# Patient Record
Sex: Female | Born: 1995 | Race: White | Hispanic: No | Marital: Single | Smoking: Former smoker
Health system: Southern US, Community
[De-identification: ages and names within clinical notes are randomized; demographics above are authoritative.]

## PROBLEM LIST (undated history)

## (undated) DIAGNOSIS — F419 Anxiety disorder, unspecified: Secondary | ICD-10-CM

## (undated) DIAGNOSIS — F4310 Post-traumatic stress disorder, unspecified: Secondary | ICD-10-CM

## (undated) DIAGNOSIS — F329 Major depressive disorder, single episode, unspecified: Secondary | ICD-10-CM

## (undated) DIAGNOSIS — F32A Depression, unspecified: Secondary | ICD-10-CM

## (undated) DIAGNOSIS — I517 Cardiomegaly: Secondary | ICD-10-CM

## (undated) DIAGNOSIS — F603 Borderline personality disorder: Secondary | ICD-10-CM

## (undated) DIAGNOSIS — J189 Pneumonia, unspecified organism: Secondary | ICD-10-CM

## (undated) HISTORY — DX: Borderline personality disorder: F60.3

## (undated) HISTORY — DX: Depression, unspecified: F32.A

## (undated) HISTORY — DX: Anxiety disorder, unspecified: F41.9

## (undated) HISTORY — DX: Cardiomegaly: I51.7

## (undated) HISTORY — DX: Pneumonia, unspecified organism: J18.9

## (undated) HISTORY — DX: Post-traumatic stress disorder, unspecified: F43.10

## (undated) HISTORY — DX: Major depressive disorder, single episode, unspecified: F32.9

## (undated) HISTORY — PX: WISDOM TOOTH EXTRACTION: SHX21

---
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
## 2017-11-20 ENCOUNTER — Encounter: Payer: Self-pay | Admitting: Adult Health

## 2017-11-20 ENCOUNTER — Ambulatory Visit: Payer: BC Managed Care – PPO | Admitting: Adult Health

## 2017-11-20 VITALS — BP 117/74 | HR 91 | Ht 63.25 in | Wt 293.7 lb

## 2017-11-20 DIAGNOSIS — Z8659 Personal history of other mental and behavioral disorders: Secondary | ICD-10-CM | POA: Insufficient documentation

## 2017-11-20 DIAGNOSIS — Z0000 Encounter for general adult medical examination without abnormal findings: Secondary | ICD-10-CM | POA: Diagnosis not present

## 2017-11-20 DIAGNOSIS — F339 Major depressive disorder, recurrent, unspecified: Secondary | ICD-10-CM | POA: Diagnosis not present

## 2017-11-20 DIAGNOSIS — F419 Anxiety disorder, unspecified: Secondary | ICD-10-CM | POA: Diagnosis not present

## 2017-11-20 DIAGNOSIS — F603 Borderline personality disorder: Secondary | ICD-10-CM | POA: Insufficient documentation

## 2017-11-20 NOTE — Assessment & Plan Note (Signed)
Body mass index is 51.62 kg/m.  Current wt 293 Increase water intake, strive for at least 100 ounces/day.   Follow Mediterranean diet Increase regular exercise.  Recommend at least 30 minutes daily, 5 days per week of walking, jogging, biking, swimming, YouTube/Pinterest workout videos.

## 2017-11-20 NOTE — Assessment & Plan Note (Signed)
She was started on Abilify 2mg  QD and Venlafaxine 37.5mg  QD April 2019 She moved back to [place] approx 4 weeks ago She has established with local therapist- Once weekly therapy sessions with Rodena Goldmann, Ph.D [place] [phone] Referral to local psychiatrist placed

## 2017-11-20 NOTE — Patient Instructions (Addendum)
Mediterranean Diet A Mediterranean diet refers to food and lifestyle choices that are based on the traditions of countries located on the [place] Sea. This way of eating has been shown to help prevent certain conditions and improve outcomes for people who have chronic diseases, like kidney disease and heart disease. What are tips for following this plan? Lifestyle  Cook and eat meals together with your family, when possible.  Drink enough fluid to keep your urine clear or pale yellow.  Be physically active every day. This includes: ? Aerobic exercise like running or swimming. ? Leisure activities like gardening, walking, or housework.  Get 7-8 hours of sleep each night.  If recommended by your health care provider, drink red wine in moderation. This means 1 glass a day for nonpregnant women and 2 glasses a day for men. A glass of wine equals 5 oz (150 mL). Reading food labels  Check the serving size of packaged foods. For foods such as rice and pasta, the serving size refers to the amount of cooked product, not dry.  Check the total fat in packaged foods. Avoid foods that have saturated fat or trans fats.  Check the ingredients list for added sugars, such as corn syrup. Shopping  At the grocery store, buy most of your food from the areas near the walls of the store. This includes: ? Fresh fruits and vegetables (produce). ? Grains, beans, nuts, and seeds. Some of these may be available in unpackaged forms or large amounts (in bulk). ? Fresh seafood. ? Poultry and eggs. ? Low-fat dairy products.  Buy whole ingredients instead of prepackaged foods.  Buy fresh fruits and vegetables in-season from local farmers markets.  Buy frozen fruits and vegetables in resealable bags.  If you do not have access to quality fresh seafood, buy precooked frozen shrimp or canned fish, such as tuna, salmon, or sardines.  Buy small amounts of raw or cooked vegetables, salads, or olives from the  deli or salad bar at your store.  Stock your pantry so you always have certain foods on hand, such as olive oil, canned tuna, canned tomatoes, rice, pasta, and beans. Cooking  Cook foods with extra-virgin olive oil instead of using butter or other vegetable oils.  Have meat as a side dish, and have vegetables or grains as your main dish. This means having meat in small portions or adding small amounts of meat to foods like pasta or stew.  Use beans or vegetables instead of meat in common dishes like chili or lasagna.  Experiment with different cooking methods. Try roasting or broiling vegetables instead of steaming or sauteing them.  Add frozen vegetables to soups, stews, pasta, or rice.  Add nuts or seeds for added healthy fat at each meal. You can add these to yogurt, salads, or vegetable dishes.  Marinate fish or vegetables using olive oil, lemon juice, garlic, and fresh herbs. Meal planning  Plan to eat 1 vegetarian meal one day each week. Try to work up to 2 vegetarian meals, if possible.  Eat seafood 2 or more times a week.  Have healthy snacks readily available, such as: ? Vegetable sticks with hummus. ? Greek yogurt. ? Fruit and nut trail mix.  Eat balanced meals throughout the week. This includes: ? Fruit: 2-3 servings a day ? Vegetables: 4-5 servings a day ? Low-fat dairy: 2 servings a day ? Fish, poultry, or lean meat: 1 serving a day ? Beans and legumes: 2 or more servings a week ? Nuts   and seeds: 1-2 servings a day ? Whole grains: 6-8 servings a day ? Extra-virgin olive oil: 3-4 servings a day  Limit red meat and sweets to only a few servings a month What are my food choices?  Mediterranean diet ? Recommended ? Grains: Whole-grain pasta. Brown rice. Bulgar wheat. Polenta. Couscous. Whole-wheat bread. [place]. ? Vegetables: Artichokes. Beets. Broccoli. Cabbage. Carrots. Eggplant. Green beans. Chard. Kale. Spinach. Onions. Leeks. Peas. Squash.  Tomatoes. Peppers. Radishes. ? Fruits: Apples. Apricots. Avocado. Berries. Bananas. Cherries. Dates. Figs. Grapes. Lemons. Melon. Oranges. Peaches. Plums. Pomegranate. ? Meats and other protein foods: Beans. Almonds. Sunflower seeds. Pine nuts. Peanuts. [place]. Salmon. Scallops. Shrimp. [place]. Tilapia. Clams. Oysters. Eggs. ? Dairy: Low-fat milk. Cheese. Greek yogurt. ? Beverages: Water. Red wine. Herbal tea. ? Fats and oils: Extra virgin olive oil. Avocado oil. Grape seed oil. ? Sweets and desserts: [place] yogurt with honey. Baked apples. Poached pears. Trail mix. ? Seasoning and other foods: Basil. Cilantro. Coriander. Cumin. Mint. Parsley. Sage. Rosemary. Tarragon. Garlic. Oregano. Thyme. Pepper. Balsalmic vinegar. Tahini. Hummus. Tomato sauce. Olives. Mushrooms. ? Limit these ? Grains: Prepackaged pasta or rice dishes. Prepackaged cereal with added sugar. ? Vegetables: Deep fried potatoes (french fries). ? Fruits: Fruit canned in syrup. ? Meats and other protein foods: Beef. Pork. Lamb. Poultry with skin. Hot dogs. Berniece Salines. ? Dairy: Ice cream. Sour cream. Whole milk. ? Beverages: Juice. Sugar-sweetened soft drinks. Beer. Liquor and spirits. ? Fats and oils: Butter. Canola oil. Vegetable oil. Beef fat (tallow). Lard. ? Sweets and desserts: Cookies. Cakes. Pies. Candy. ? Seasoning and other foods: Mayonnaise. Premade sauces and marinades. ? The items listed may not be a complete list. Talk with your dietitian about what dietary choices are right for you. Summary  The Mediterranean diet includes both food and lifestyle choices.  Eat a variety of fresh fruits and vegetables, beans, nuts, seeds, and whole grains.  Limit the amount of red meat and sweets that you eat.  Talk with your health care provider about whether it is safe for you to drink red wine in moderation. This means 1 glass a day for nonpregnant women and 2 glasses a day for men. A glass of wine equals 5 oz (150 mL). This information  is not intended to replace advice given to you by your health care provider. Make sure you discuss any questions you have with your health care provider. Document Released: 12/01/2015 Document Revised: 01/03/2016 Document Reviewed: 12/01/2015 Elsevier Interactive Patient Education  2018 [employer].   Exercising to [employer] Exercising can help you to lose weight. In order to lose weight through exercise, you need to do vigorous-intensity exercise. You can tell that you are exercising with vigorous intensity if you are breathing very hard and fast and cannot hold a conversation while exercising. Moderate-intensity exercise helps to maintain your current weight. You can tell that you are exercising at a moderate level if you have a higher heart rate and faster breathing, but you are still able to hold a conversation. How often should I exercise? Choose an activity that you enjoy and set realistic goals. Your health care provider can help you to make an activity plan that works for you. Exercise regularly as directed by your health care provider. This may include:  Doing resistance training twice each week, such as: ? Push-ups. ? Sit-ups. ? Lifting weights. ? Using resistance bands.  Doing a given intensity of exercise for a given amount of time. Choose from these options: ? 150  minutes of moderate-intensity exercise every week. ? 75 minutes of vigorous-intensity exercise every week. ? A mix of moderate-intensity and vigorous-intensity exercise every week.  Children, pregnant women, people who are out of shape, people who are overweight, and older adults may need to consult a health care provider for individual recommendations. If you have any sort of medical condition, be sure to consult your health care provider before starting a new exercise program. What are some activities that can help me to lose weight?  Walking at a rate of at least 4.5 miles an hour.  Jogging or running at a  rate of 5 miles per hour.  Biking at a rate of at least 10 miles per hour.  Lap swimming.  Roller-skating or in-line skating.  Cross-country skiing.  Vigorous competitive sports, such as football, basketball, and soccer.  Jumping rope.  Aerobic dancing. How can I be more active in my day-to-day activities?  Use the stairs instead of the elevator.  Take a walk during your lunch break.  If you drive, park your car farther away from work or school.  If you take public transportation, get off one stop early and walk the rest of the way.  Make all of your phone calls while standing up and walking around.  Get up, stretch, and walk around every 30 minutes throughout the day. What guidelines should I follow while exercising?  Do not exercise so much that you hurt yourself, feel dizzy, or get very short of breath.  Consult your health care provider prior to starting a new exercise program.  Wear comfortable clothes and shoes with good support.  Drink plenty of water while you exercise to prevent dehydration or heat stroke. Body water is lost during exercise and must be replaced.  Work out until you breathe faster and your heart beats faster. This information is not intended to replace advice given to you by your health care provider. Make sure you discuss any questions you have with your health care provider. Document Released: 05/12/2010 Document Revised: 09/15/2015 Document Reviewed: 09/10/2013 Elsevier Interactive Patient Education  2018 [employer].  Increase water intake, strive for at least 100 ounces/day.   Follow Mediterranean diet Increase regular exercise.  Recommend at least 30 minutes daily, 5 days per week of walking, jogging, biking, swimming, YouTube/Pinterest workout videos. Referral to OB/GYN placed Referral to Psychiatry placed Continue with all medications as directed. Continue with therapist as directed. Please schedule fasting lab appt in few  weeks. Please schedule complete physical in 1 month. WELCOME TO THE PRACTICE!
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
## 2017-11-20 NOTE — Assessment & Plan Note (Signed)
Increase water intake, strive for at least 100 ounces/day.   Follow Mediterranean diet Increase regular exercise.  Recommend at least 30 minutes daily, 5 days per week of walking, jogging, biking, swimming, YouTube/Pinterest workout videos. Referral to OB/GYN placed Referral to Psychiatry placed Continue with all medications as directed. Continue with therapist as directed. Please schedule fasting lab appt in few weeks. Please schedule complete physical in 1 month.

## 2017-12-06 ENCOUNTER — Other Ambulatory Visit: Payer: Self-pay | Admitting: Adult Health

## 2017-12-06 ENCOUNTER — Other Ambulatory Visit (INDEPENDENT_AMBULATORY_CARE_PROVIDER_SITE_OTHER): Payer: BC Managed Care – PPO

## 2017-12-06 DIAGNOSIS — Z0000 Encounter for general adult medical examination without abnormal findings: Secondary | ICD-10-CM

## 2017-12-06 NOTE — Telephone Encounter (Signed)
Patient came in for labs & states she forgot to ask for Rx refills on meds-- advised her provider is out of office till Monday, 8/19 and forwarding request to medical assistant for her.  --Forwarding message to medical assistant to contact pt for specific meds  Not listed but refilled needed on.  --glh

## 2017-12-07 LAB — CBC WITH DIFFERENTIAL/PLATELET
Basophils Absolute: 0 10*3/uL (ref 0.0–0.2)
Basos: 0 %
EOS (ABSOLUTE): 0.1 10*3/uL (ref 0.0–0.4)
Eos: 1 %
HEMOGLOBIN: 12 g/dL (ref 11.1–15.9)
Hematocrit: 37.2 % (ref 34.0–46.6)
IMMATURE GRANS (ABS): 0 10*3/uL (ref 0.0–0.1)
IMMATURE GRANULOCYTES: 0 %
LYMPHS: 24 %
Lymphocytes Absolute: 2.1 10*3/uL (ref 0.7–3.1)
MCH: 26.4 pg — ABNORMAL LOW (ref 26.6–33.0)
MCHC: 32.3 g/dL (ref 31.5–35.7)
MCV: 82 fL (ref 79–97)
MONOCYTES: 7 %
Monocytes Absolute: 0.7 10*3/uL (ref 0.1–0.9)
NEUTROS PCT: 68 %
Neutrophils Absolute: 6.1 10*3/uL (ref 1.4–7.0)
Platelets: 446 10*3/uL (ref 150–450)
RBC: 4.55 x10E6/uL (ref 3.77–5.28)
RDW: 15.6 % — ABNORMAL HIGH (ref 12.3–15.4)
WBC: 9.1 10*3/uL (ref 3.4–10.8)

## 2017-12-07 LAB — COMPREHENSIVE METABOLIC PANEL
ALBUMIN: 4 g/dL (ref 3.5–5.5)
ALK PHOS: 77 IU/L (ref 39–117)
ALT: 18 IU/L (ref 0–32)
AST: 16 IU/L (ref 0–40)
Albumin/Globulin Ratio: 1.2 (ref 1.2–2.2)
BUN / CREAT RATIO: 12 (ref 9–23)
BUN: 9 mg/dL (ref 6–20)
Bilirubin Total: 0.3 mg/dL (ref 0.0–1.2)
CO2: 21 mmol/L (ref 20–29)
CREATININE: 0.76 mg/dL (ref 0.57–1.00)
Calcium: 9 mg/dL (ref 8.7–10.2)
Chloride: 107 mmol/L — ABNORMAL HIGH (ref 96–106)
GFR calc Af Amer: 130 mL/min/{1.73_m2} (ref >59)
GFR calc non Af Amer: 112 mL/min/{1.73_m2} (ref >59)
GLUCOSE: 81 mg/dL (ref 65–99)
Globulin, Total: 3.4 g/dL (ref 1.5–4.5)
Potassium: 4.7 mmol/L (ref 3.5–5.2)
Sodium: 141 mmol/L (ref 134–144)
Total Protein: 7.4 g/dL (ref 6.0–8.5)

## 2017-12-07 LAB — LIPID PANEL
CHOLESTEROL TOTAL: 209 mg/dL — AB (ref 100–199)
Chol/HDL Ratio: 4.3 ratio (ref 0.0–4.4)
HDL: 49 mg/dL (ref >39)
LDL Calculated: 142 mg/dL — ABNORMAL HIGH (ref 0–99)
TRIGLYCERIDES: 88 mg/dL (ref 0–149)
VLDL Cholesterol Cal: 18 mg/dL (ref 5–40)

## 2017-12-07 LAB — TSH: TSH: 2.5 u[IU]/mL (ref 0.450–4.500)

## 2017-12-07 LAB — HEMOGLOBIN A1C
Est. average glucose Bld gHb Est-mCnc: 100 mg/dL
Hgb A1c MFr Bld: 5.1 % (ref 4.8–5.6)

## 2017-12-09 ENCOUNTER — Other Ambulatory Visit: Payer: Self-pay | Admitting: Adult Health

## 2017-12-09 MED ORDER — ARIPIPRAZOLE 2 MG PO TABS: 2.0000 mg | ORAL_TABLET | Freq: Every day | ORAL | 0 refills | Status: DC

## 2017-12-09 MED ORDER — VENLAFAXINE HCL ER 37.5 MG PO CP24: 37.5000 mg | ORAL_CAPSULE | Freq: Every day | ORAL | 0 refills | Status: DC

## 2017-12-09 NOTE — Telephone Encounter (Signed)
Good Morning Tonya, Can you please call Ms. Moore and inquire if she has made appt with psychiatrist? Referral was placed 31 Jul. Her mental health medication should be managed by psychiatrist- but is she needs it I will send in one RF. Thanks! Valetta Fuller

## 2017-12-09 NOTE — Telephone Encounter (Signed)
We have not prescribed these medications for the patient previously.  Please review and refill if appropriate.  T. Phallon Haydu, CMA  

## 2017-12-09 NOTE — Telephone Encounter (Signed)
Good Afternoon Tonya, Can you please call Ms. Jamison and share- I sent in 90 day supply of each med. Please encourage her to establish with psychiatry ASAP. Thanks! Valetta Fuller

## 2017-12-09 NOTE — Telephone Encounter (Signed)
Pt's mother states that pt is back at college and they are having a difficult time getting appt scheduled so that it does not interfere with school schedule.  She states that she will continue to try to get appt scheduled.  Advised pt that Valetta Fuller wants these medications managed by psychiatry.  Pt's mother expressed understanding.  Charyl Bigger, CMA
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
## 2017-12-19 ENCOUNTER — Encounter: Payer: BC Managed Care – PPO | Admitting: Adult Health
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
## 2018-01-14 ENCOUNTER — Ambulatory Visit (INDEPENDENT_AMBULATORY_CARE_PROVIDER_SITE_OTHER): Payer: BC Managed Care – PPO | Admitting: Adult Health

## 2018-01-14 ENCOUNTER — Encounter: Payer: Self-pay | Admitting: Adult Health

## 2018-01-14 ENCOUNTER — Other Ambulatory Visit (HOSPITAL_COMMUNITY)
Admission: RE | Admit: 2018-01-14 | Discharge: 2018-01-14 | Disposition: A | Discharge status: Home / Self Care | Payer: BC Managed Care – PPO | Source: Ambulatory Visit | Attending: Adult Health | Admitting: Adult Health

## 2018-01-14 VITALS — BP 107/70 | HR 79 | Ht 63.25 in | Wt 296.4 lb

## 2018-01-14 DIAGNOSIS — Z113 Encounter for screening for infections with a predominantly sexual mode of transmission: Secondary | ICD-10-CM | POA: Diagnosis not present

## 2018-01-14 DIAGNOSIS — Z124 Encounter for screening for malignant neoplasm of cervix: Secondary | ICD-10-CM

## 2018-01-14 DIAGNOSIS — Z0000 Encounter for general adult medical examination without abnormal findings: Secondary | ICD-10-CM

## 2018-01-14 DIAGNOSIS — Z8659 Personal history of other mental and behavioral disorders: Secondary | ICD-10-CM | POA: Diagnosis not present

## 2018-01-14 MED ORDER — VENLAFAXINE HCL ER 37.5 MG PO CP24: 37.5000 mg | ORAL_CAPSULE | Freq: Every day | ORAL | 0 refills | Status: DC

## 2018-01-14 MED ORDER — ARIPIPRAZOLE 2 MG PO TABS: 2.0000 mg | ORAL_TABLET | Freq: Every day | ORAL | 0 refills | Status: DC

## 2018-01-14 NOTE — Assessment & Plan Note (Signed)
Body mass index is 52.09 kg/m.  Current wt 296 Follow Mediterranean diet and increase regular walking

## 2018-01-14 NOTE — Assessment & Plan Note (Signed)
Referral to Psychiatry placed 11/20/17- she has not received call to schedule as a new pt. I sent in one refill on Aripiprazole (Abilify) 2mg  QD Once she establishes, that practice will take over medication management

## 2018-01-14 NOTE — Assessment & Plan Note (Signed)
Continue medications as directed- I sent refills in.  Once you establish with a psychiatrist please have them refill the venlafaxine and Abilify. Increase water intake, strive for at least 75 ounces/day.   Follow Mediterranean Diet.  Increase regular exercise.  Recommend at least 30 minutes daily, 5 days per week of walking, jogging, biking, swimming, YouTube/Pinterest workout videos. Overall your labs looks good, try to reduce saturated fat. Recommend annual physical with fasting labs.

## 2018-01-14 NOTE — Patient Instructions (Signed)
Preventive Care for Adults, Female  A healthy lifestyle and preventive care can promote health and wellness. Preventive health guidelines for women include the following key practices.   A routine yearly physical is a good way to check with your health care provider about your health and preventive screening. It is a chance to share any concerns and updates on your health and to receive a thorough exam.   Visit your dentist for a routine exam and preventive care every 6 months. Brush your teeth twice a day and floss once a day. Good oral hygiene prevents tooth decay and gum disease.   The frequency of eye exams is based on your age, health, family medical history, use of contact lenses, and other factors. Follow your health care provider's recommendations for frequency of eye exams.   Eat a healthy diet. Foods like vegetables, fruits, whole grains, low-fat dairy products, and lean protein foods contain the nutrients you need without too many calories. Decrease your intake of foods high in solid fats, added sugars, and salt. Eat the right amount of calories for you.Get information about a proper diet from your health care provider, if necessary.   Regular physical exercise is one of the most important things you can do for your health. Most adults should get at least 150 minutes of moderate-intensity exercise (any activity that increases your heart rate and causes you to sweat) each week. In addition, most adults need muscle-strengthening exercises on 2 or more days a week.   Maintain a healthy weight. The body mass index (BMI) is a screening tool to identify possible weight problems. It provides an estimate of body fat based on height and weight. Your health care provider can find your BMI, and can help you achieve or maintain a healthy weight.For adults 20 years and older:   - A BMI below 18.5 is considered underweight.   - A BMI of 18.5 to 24.9 is normal.   - A BMI of 25 to 29.9 is  considered overweight.   - A BMI of 30 and above is considered obese.   Maintain normal blood lipids and cholesterol levels by exercising and minimizing your intake of trans and saturated fats.  Eat a balanced diet with plenty of fruit and vegetables. Blood tests for lipids and cholesterol should begin at age 20 and be repeated every 5 years minimum.  If your lipid or cholesterol levels are high, you are over 40, or you are at high risk for heart disease, you may need your cholesterol levels checked more frequently.Ongoing high lipid and cholesterol levels should be treated with medicines if diet and exercise are not working.   If you smoke, find out from your health care provider how to quit. If you do not use tobacco, do not start.   Lung cancer screening is recommended for adults aged 55-80 years who are at high risk for developing lung cancer because of a history of smoking. A yearly low-dose CT scan of the lungs is recommended for people who have at least a 30-pack-year history of smoking and are a current smoker or have quit within the past 15 years. A pack year of smoking is smoking an average of 1 pack of cigarettes a day for 1 year (for example: 1 pack a day for 30 years or 2 packs a day for 15 years). Yearly screening should continue until the smoker has stopped smoking for at least 15 years. Yearly screening should be stopped for people who develop a   health problem that would prevent them from having lung cancer treatment.   If you are pregnant, do not drink alcohol. If you are breastfeeding, be very cautious about drinking alcohol. If you are not pregnant and choose to drink alcohol, do not have more than 1 drink per day. One drink is considered to be 12 ounces (355 mL) of beer, 5 ounces (148 mL) of wine, or 1.5 ounces (44 mL) of liquor.   Avoid use of street drugs. Do not share needles with anyone. Ask for help if you need support or instructions about stopping the use of  drugs.   High blood pressure causes heart disease and increases the risk of stroke. Your blood pressure should be checked at least yearly.  Ongoing high blood pressure should be treated with medicines if weight loss and exercise do not work.   If you are 69-55 years old, ask your health care provider if you should take aspirin to prevent strokes.   Diabetes screening involves taking a blood sample to check your fasting blood sugar level. This should be done once every 3 years, after age 38, if you are within normal weight and without risk factors for diabetes. Testing should be considered at a younger age or be carried out more frequently if you are overweight and have at least 1 risk factor for diabetes.   Breast cancer screening is essential preventive care for women. You should practice "breast self-awareness."  This means understanding the normal appearance and feel of your breasts and may include breast self-examination.  Any changes detected, no matter how small, should be reported to a health care provider.  Women in their 80s and 30s should have a clinical breast exam (CBE) by a health care provider as part of a regular health exam every 1 to 3 years.  After age 66, women should have a CBE every year.  Starting at age 1, women should consider having a mammogram (breast X-ray test) every year.  Women who have a family history of breast cancer should talk to their health care provider about genetic screening.  Women at a high risk of breast cancer should talk to their health care providers about having an MRI and a mammogram every year.   -Breast cancer gene (BRCA)-related cancer risk assessment is recommended for women who have family members with BRCA-related cancers. BRCA-related cancers include breast, ovarian, tubal, and peritoneal cancers. Having family members with these cancers may be associated with an increased risk for harmful changes (mutations) in the breast cancer genes BRCA1 and  BRCA2. Results of the assessment will determine the need for genetic counseling and BRCA1 and BRCA2 testing.   The Pap test is a screening test for cervical cancer. A Pap test can show cell changes on the cervix that might become cervical cancer if left untreated. A Pap test is a procedure in which cells are obtained and examined from the lower end of the uterus (cervix).   - Women should have a Pap test starting at age 57.   - Between ages 90 and 70, Pap tests should be repeated every 2 years.   - Beginning at age 63, you should have a Pap test every 3 years as long as the past 3 Pap tests have been normal.   - Some women have medical problems that increase the chance of getting cervical cancer. Talk to your health care provider about these problems. It is especially important to talk to your health care provider if a  new problem develops soon after your last Pap test. In these cases, your health care provider may recommend more frequent screening and Pap tests.   - The above recommendations are the same for women who have or have not gotten the vaccine for human papillomavirus (HPV).   - If you had a hysterectomy for a problem that was not cancer or a condition that could lead to cancer, then you no longer need Pap tests. Even if you no longer need a Pap test, a regular exam is a good idea to make sure no other problems are starting.   - If you are between ages 36 and 66 years, and you have had normal Pap tests going back 10 years, you no longer need Pap tests. Even if you no longer need a Pap test, a regular exam is a good idea to make sure no other problems are starting.   - If you have had past treatment for cervical cancer or a condition that could lead to cancer, you need Pap tests and screening for cancer for at least 20 years after your treatment.   - If Pap tests have been discontinued, risk factors (such as a new sexual partner) need to be reassessed to determine if screening should  be resumed.   - The HPV test is an additional test that may be used for cervical cancer screening. The HPV test looks for the virus that can cause the cell changes on the cervix. The cells collected during the Pap test can be tested for HPV. The HPV test could be used to screen women aged 70 years and older, and should be used in women of any age who have unclear Pap test results. After the age of 67, women should have HPV testing at the same frequency as a Pap test.   Colorectal cancer can be detected and often prevented. Most routine colorectal cancer screening begins at the age of 57 years and continues through age 26 years. However, your health care provider may recommend screening at an earlier age if you have risk factors for colon cancer. On a yearly basis, your health care provider may provide home test kits to check for hidden blood in the stool.  Use of a small camera at the end of a tube, to directly examine the colon (sigmoidoscopy or colonoscopy), can detect the earliest forms of colorectal cancer. Talk to your health care provider about this at age 23, when routine screening begins. Direct exam of the colon should be repeated every 5 -10 years through age 49 years, unless early forms of pre-cancerous polyps or small growths are found.   People who are at an increased risk for hepatitis B should be screened for this virus. You are considered at high risk for hepatitis B if:  -You were born in a country where hepatitis B occurs often. Talk with your health care provider about which countries are considered high risk.  - Your parents were born in a high-risk country and you have not received a shot to protect against hepatitis B (hepatitis B vaccine).  - You have HIV or AIDS.  - You use needles to inject street drugs.  - You live with, or have sex with, someone who has Hepatitis B.  - You get hemodialysis treatment.  - You take certain medicines for conditions like cancer, organ  transplantation, and autoimmune conditions.   Hepatitis C blood testing is recommended for all people born from [birth year] through 1965 and any individual  with known risks for hepatitis C.   Practice safe sex. Use condoms and avoid high-risk sexual practices to reduce the spread of sexually transmitted infections (STIs). STIs include gonorrhea, chlamydia, syphilis, trichomonas, herpes, HPV, and human immunodeficiency virus (HIV). Herpes, HIV, and HPV are viral illnesses that have no cure. They can result in disability, cancer, and death. Sexually active women aged 25 years and younger should be checked for chlamydia. Older women with new or multiple partners should also be tested for chlamydia. Testing for other STIs is recommended if you are sexually active and at increased risk.   Osteoporosis is a disease in which the bones lose minerals and strength with aging. This can result in serious bone fractures or breaks. The risk of osteoporosis can be identified using a bone density scan. Women ages 65 years and over and women at risk for fractures or osteoporosis should discuss screening with their health care providers. Ask your health care provider whether you should take a calcium supplement or vitamin D to There are also several preventive steps women can take to avoid osteoporosis and resulting fractures or to keep osteoporosis from worsening. -->Recommendations include:  Eat a balanced diet high in fruits, vegetables, calcium, and vitamins.  Get enough calcium. The recommended total intake of is 1,200 mg daily; for best absorption, if taking supplements, divide doses into 250-500 mg doses throughout the day. Of the two types of calcium, calcium carbonate is best absorbed when taken with food but calcium citrate can be taken on an empty stomach.  Get enough vitamin D. NAMS and the National Osteoporosis Foundation recommend at least 1,000 IU per day for women age 50 and over who are at risk of vitamin D  deficiency. Vitamin D deficiency can be caused by inadequate sun exposure (for example, those who live in northern latitudes).  Avoid alcohol and smoking. Heavy alcohol intake (more than 7 drinks per week) increases the risk of falls and hip fracture and women smokers tend to lose bone more rapidly and have lower bone mass than nonsmokers. Stopping smoking is one of the most important changes women can make to improve their health and decrease risk for disease.  Be physically active every day. Weight-bearing exercise (for example, fast walking, hiking, jogging, and weight training) may strengthen bones or slow the rate of bone loss that comes with aging. Balancing and muscle-strengthening exercises can reduce the risk of falling and fracture.  Consider therapeutic medications. Currently, several types of effective drugs are available. Healthcare providers can recommend the type most appropriate for each woman.  Eliminate environmental factors that may contribute to accidents. Falls cause nearly 90% of all osteoporotic fractures, so reducing this risk is an important bone-health strategy. Measures include ample lighting, removing obstructions to walking, using nonskid rugs on floors, and placing mats and/or grab bars in showers.  Be aware of medication side effects. Some common medicines make bones weaker. These include a type of steroid drug called glucocorticoids used for arthritis and asthma, some antiseizure drugs, certain sleeping pills, treatments for endometriosis, and some cancer drugs. An overactive thyroid gland or using too much thyroid hormone for an underactive thyroid can also be a problem. If you are taking these medicines, talk to your doctor about what you can do to help protect your bones.reduce the rate of osteoporosis.    Menopause can be associated with physical symptoms and risks. Hormone replacement therapy is available to decrease symptoms and risks. You should talk to your  health care provider   about whether hormone replacement therapy is right for you.   Use sunscreen. Apply sunscreen liberally and repeatedly throughout the day. You should seek shade when your shadow is shorter than you. Protect yourself by wearing long sleeves, pants, a wide-brimmed hat, and sunglasses year round, whenever you are outdoors.   Once a month, do a whole body skin exam, using a mirror to look at the skin on your back. Tell your health care provider of new moles, moles that have irregular borders, moles that are larger than a pencil eraser, or moles that have changed in shape or color.   -Stay current with required vaccines (immunizations).   Influenza vaccine. All adults should be immunized every year.  Tetanus, diphtheria, and acellular pertussis (Td, Tdap) vaccine. Pregnant women should receive 1 dose of Tdap vaccine during each pregnancy. The dose should be obtained regardless of the length of time since the last dose. Immunization is preferred during the 27th 36th week of gestation. An adult who has not previously received Tdap or who does not know her vaccine status should receive 1 dose of Tdap. This initial dose should be followed by tetanus and diphtheria toxoids (Td) booster doses every 10 years. Adults with an unknown or incomplete history of completing a 3-dose immunization series with Td-containing vaccines should begin or complete a primary immunization series including a Tdap dose. Adults should receive a Td booster every 10 years.  Varicella vaccine. An adult without evidence of immunity to varicella should receive 2 doses or a second dose if she has previously received 1 dose. Pregnant females who do not have evidence of immunity should receive the first dose after pregnancy. This first dose should be obtained before leaving the health care facility. The second dose should be obtained 4 8 weeks after the first dose.  Human papillomavirus (HPV) vaccine. Females aged 13 26  years who have not received the vaccine previously should obtain the 3-dose series. The vaccine is not recommended for use in pregnant females. However, pregnancy testing is not needed before receiving a dose. If a female is found to be pregnant after receiving a dose, no treatment is needed. In that case, the remaining doses should be delayed until after the pregnancy. Immunization is recommended for any person with an immunocompromised condition through the age of 26 years if she did not get any or all doses earlier. During the 3-dose series, the second dose should be obtained 4 8 weeks after the first dose. The third dose should be obtained 24 weeks after the first dose and 16 weeks after the second dose.  Zoster vaccine. One dose is recommended for adults aged 60 years or older unless certain conditions are present.  Measles, mumps, and rubella (MMR) vaccine. Adults born before 1957 generally are considered immune to measles and mumps. Adults born in 1957 or later should have 1 or more doses of MMR vaccine unless there is a contraindication to the vaccine or there is laboratory evidence of immunity to each of the three diseases. A routine second dose of MMR vaccine should be obtained at least 28 days after the first dose for students attending postsecondary schools, health care workers, or international travelers. People who received inactivated measles vaccine or an unknown type of measles vaccine during 1963 1967 should receive 2 doses of MMR vaccine. People who received inactivated mumps vaccine or an unknown type of mumps vaccine before 1979 and are at high risk for mumps infection should consider immunization with 2 doses of   MMR vaccine. For females of childbearing age, rubella immunity should be determined. If there is no evidence of immunity, females who are not pregnant should be vaccinated. If there is no evidence of immunity, females who are pregnant should delay immunization until after pregnancy.  Unvaccinated health care workers born before [birth year] who lack laboratory evidence of measles, mumps, or rubella immunity or laboratory confirmation of disease should consider measles and mumps immunization with 2 doses of MMR vaccine or rubella immunization with 1 dose of MMR vaccine.  Pneumococcal 13-valent conjugate (PCV13) vaccine. When indicated, a person who is uncertain of her immunization history and has no record of immunization should receive the PCV13 vaccine. An adult aged 54 years or older who has certain medical conditions and has not been previously immunized should receive 1 dose of PCV13 vaccine. This PCV13 should be followed with a dose of pneumococcal polysaccharide (PPSV23) vaccine. The PPSV23 vaccine dose should be obtained at least 8 weeks after the dose of PCV13 vaccine. An adult aged 58 years or older who has certain medical conditions and previously received 1 or more doses of PPSV23 vaccine should receive 1 dose of PCV13. The PCV13 vaccine dose should be obtained 1 or more years after the last PPSV23 vaccine dose.  Pneumococcal polysaccharide (PPSV23) vaccine. When PCV13 is also indicated, PCV13 should be obtained first. All adults aged 58 years and older should be immunized. An adult younger than age 65 years who has certain medical conditions should be immunized. Any person who resides in a nursing home or long-term care facility should be immunized. An adult smoker should be immunized. People with an immunocompromised condition and certain other conditions should receive both PCV13 and PPSV23 vaccines. People with human immunodeficiency virus (HIV) infection should be immunized as soon as possible after diagnosis. Immunization during chemotherapy or radiation therapy should be avoided. Routine use of PPSV23 vaccine is not recommended for American Indians, [place] Natives, or people younger than 65 years unless there are medical conditions that require PPSV23 vaccine. When indicated,  people who have unknown immunization and have no record of immunization should receive PPSV23 vaccine. One-time revaccination 5 years after the first dose of PPSV23 is recommended for people aged 70 64 years who have chronic kidney failure, nephrotic syndrome, asplenia, or immunocompromised conditions. People who received 1 2 doses of PPSV23 before age 32 years should receive another dose of PPSV23 vaccine at age 96 years or later if at least 5 years have passed since the previous dose. Doses of PPSV23 are not needed for people immunized with PPSV23 at or after age 55 years.  Meningococcal vaccine. Adults with asplenia or persistent complement component deficiencies should receive 2 doses of quadrivalent meningococcal conjugate (MenACWY-D) vaccine. The doses should be obtained at least 2 months apart. Microbiologists working with certain meningococcal bacteria, [employer] recruits, people at risk during an outbreak, and people who travel to or live in countries with a high rate of meningitis should be immunized. A first-year college student up through age 58 years who is living in a residence hall should receive a dose if she did not receive a dose on or after her 16th birthday. Adults who have certain high-risk conditions should receive one or more doses of vaccine.  Hepatitis A vaccine. Adults who wish to be protected from this disease, have certain high-risk conditions, work with hepatitis A-infected animals, work in hepatitis A research labs, or travel to or work in countries with a high rate of hepatitis A should be  immunized. Adults who were previously unvaccinated and who anticipate close contact with an international adoptee during the first 60 days after arrival in the [place] from a country with a high rate of hepatitis A should be immunized.  Hepatitis B vaccine.  Adults who wish to be protected from this disease, have certain high-risk conditions, may be exposed to blood or other infectious  body fluids, are household contacts or sex partners of hepatitis B positive people, are clients or workers in certain care facilities, or travel to or work in countries with a high rate of hepatitis B should be immunized.  Haemophilus influenzae type b (Hib) vaccine. A previously unvaccinated person with asplenia or sickle cell disease or having a scheduled splenectomy should receive 1 dose of Hib vaccine. Regardless of previous immunization, a recipient of a hematopoietic stem cell transplant should receive a 3-dose series 6 12 months after her successful transplant. Hib vaccine is not recommended for adults with HIV infection.  Preventive Services / Frequency Ages 6 to 39years  Blood pressure check.** / Every 1 to 2 years.  Lipid and cholesterol check.** / Every 5 years beginning at age 39.  Clinical breast exam.** / Every 3 years for women in their 61s and 62s.  BRCA-related cancer risk assessment.** / For women who have family members with a BRCA-related cancer (breast, ovarian, tubal, or peritoneal cancers).  Pap test.** / Every 2 years from ages 47 through 85. Every 3 years starting at age 34 through age 12 or 74 with a history of 3 consecutive normal Pap tests.  HPV screening.** / Every 3 years from ages 46 through ages 43 to 54 with a history of 3 consecutive normal Pap tests.  Hepatitis C blood test.** / For any individual with known risks for hepatitis C.  Skin self-exam. / Monthly.  Influenza vaccine. / Every year.  Tetanus, diphtheria, and acellular pertussis (Tdap, Td) vaccine.** / Consult your health care provider. Pregnant women should receive 1 dose of Tdap vaccine during each pregnancy. 1 dose of Td every 10 years.  Varicella vaccine.** / Consult your health care provider. Pregnant females who do not have evidence of immunity should receive the first dose after pregnancy.  HPV vaccine. / 3 doses over 6 months, if 64 and younger. The vaccine is not recommended for use in  pregnant females. However, pregnancy testing is not needed before receiving a dose.  Measles, mumps, rubella (MMR) vaccine.** / You need at least 1 dose of MMR if you were born in 1957 or later. You may also need a 2nd dose. For females of childbearing age, rubella immunity should be determined. If there is no evidence of immunity, females who are not pregnant should be vaccinated. If there is no evidence of immunity, females who are pregnant should delay immunization until after pregnancy.  Pneumococcal 13-valent conjugate (PCV13) vaccine.** / Consult your health care provider.  Pneumococcal polysaccharide (PPSV23) vaccine.** / 1 to 2 doses if you smoke cigarettes or if you have certain conditions.  Meningococcal vaccine.** / 1 dose if you are age 71 to 37 years and a Market researcher living in a residence hall, or have one of several medical conditions, you need to get vaccinated against meningococcal disease. You may also need additional booster doses.  Hepatitis A vaccine.** / Consult your health care provider.  Hepatitis B vaccine.** / Consult your health care provider.  Haemophilus influenzae type b (Hib) vaccine.** / Consult your health care provider.  Ages 55 to 64years  Blood pressure check.** / Every 1 to 2 years.  Lipid and cholesterol check.** / Every 5 years beginning at age 20 years.  Lung cancer screening. / Every year if you are aged 55 80 years and have a 30-pack-year history of smoking and currently smoke or have quit within the past 15 years. Yearly screening is stopped once you have quit smoking for at least 15 years or develop a health problem that would prevent you from having lung cancer treatment.  Clinical breast exam.** / Every year after age 40 years.  BRCA-related cancer risk assessment.** / For women who have family members with a BRCA-related cancer (breast, ovarian, tubal, or peritoneal cancers).  Mammogram.** / Every year beginning at age 40  years and continuing for as long as you are in good health. Consult with your health care provider.  Pap test.** / Every 3 years starting at age 30 years through age 65 or 70 years with a history of 3 consecutive normal Pap tests.  HPV screening.** / Every 3 years from ages 30 years through ages 65 to 70 years with a history of 3 consecutive normal Pap tests.  Fecal occult blood test (FOBT) of stool. / Every year beginning at age 50 years and continuing until age 75 years. You may not need to do this test if you get a colonoscopy every 10 years.  Flexible sigmoidoscopy or colonoscopy.** / Every 5 years for a flexible sigmoidoscopy or every 10 years for a colonoscopy beginning at age 50 years and continuing until age 75 years.  Hepatitis C blood test.** / For all people born from 1945 through 1965 and any individual with known risks for hepatitis C.  Skin self-exam. / Monthly.  Influenza vaccine. / Every year.  Tetanus, diphtheria, and acellular pertussis (Tdap/Td) vaccine.** / Consult your health care provider. Pregnant women should receive 1 dose of Tdap vaccine during each pregnancy. 1 dose of Td every 10 years.  Varicella vaccine.** / Consult your health care provider. Pregnant females who do not have evidence of immunity should receive the first dose after pregnancy.  Zoster vaccine.** / 1 dose for adults aged 60 years or older.  Measles, mumps, rubella (MMR) vaccine.** / You need at least 1 dose of MMR if you were born in 1957 or later. You may also need a 2nd dose. For females of childbearing age, rubella immunity should be determined. If there is no evidence of immunity, females who are not pregnant should be vaccinated. If there is no evidence of immunity, females who are pregnant should delay immunization until after pregnancy.  Pneumococcal 13-valent conjugate (PCV13) vaccine.** / Consult your health care provider.  Pneumococcal polysaccharide (PPSV23) vaccine.** / 1 to 2 doses if  you smoke cigarettes or if you have certain conditions.  Meningococcal vaccine.** / Consult your health care provider.  Hepatitis A vaccine.** / Consult your health care provider.  Hepatitis B vaccine.** / Consult your health care provider.  Haemophilus influenzae type b (Hib) vaccine.** / Consult your health care provider.  Ages 65 years and over  Blood pressure check.** / Every 1 to 2 years.  Lipid and cholesterol check.** / Every 5 years beginning at age 20 years.  Lung cancer screening. / Every year if you are aged 55 80 years and have a 30-pack-year history of smoking and currently smoke or have quit within the past 15 years. Yearly screening is stopped once you have quit smoking for at least 15 years or develop a health problem that   would prevent you from having lung cancer treatment.  Clinical breast exam.** / Every year after age 103 years.  BRCA-related cancer risk assessment.** / For women who have family members with a BRCA-related cancer (breast, ovarian, tubal, or peritoneal cancers).  Mammogram.** / Every year beginning at age 36 years and continuing for as long as you are in good health. Consult with your health care provider.  Pap test.** / Every 3 years starting at age 5 years through age 85 or 10 years with 3 consecutive normal Pap tests. Testing can be stopped between 65 and 70 years with 3 consecutive normal Pap tests and no abnormal Pap or HPV tests in the past 10 years.  HPV screening.** / Every 3 years from ages 93 years through ages 70 or 45 years with a history of 3 consecutive normal Pap tests. Testing can be stopped between 65 and 70 years with 3 consecutive normal Pap tests and no abnormal Pap or HPV tests in the past 10 years.  Fecal occult blood test (FOBT) of stool. / Every year beginning at age 8 years and continuing until age 45 years. You may not need to do this test if you get a colonoscopy every 10 years.  Flexible sigmoidoscopy or colonoscopy.** /  Every 5 years for a flexible sigmoidoscopy or every 10 years for a colonoscopy beginning at age 69 years and continuing until age 68 years.  Hepatitis C blood test.** / For all people born from [birth year] through 1965 and any individual with known risks for hepatitis C.  Osteoporosis screening.** / A one-time screening for women ages 7 years and over and women at risk for fractures or osteoporosis.  Skin self-exam. / Monthly.  Influenza vaccine. / Every year.  Tetanus, diphtheria, and acellular pertussis (Tdap/Td) vaccine.** / 1 dose of Td every 10 years.  Varicella vaccine.** / Consult your health care provider.  Zoster vaccine.** / 1 dose for adults aged 5 years or older.  Pneumococcal 13-valent conjugate (PCV13) vaccine.** / Consult your health care provider.  Pneumococcal polysaccharide (PPSV23) vaccine.** / 1 dose for all adults aged 74 years and older.  Meningococcal vaccine.** / Consult your health care provider.  Hepatitis A vaccine.** / Consult your health care provider.  Hepatitis B vaccine.** / Consult your health care provider.  Haemophilus influenzae type b (Hib) vaccine.** / Consult your health care provider. ** Family history and personal history of risk and conditions may change your health care provider's recommendations. Document Released: 06/05/2001 Document Revised: 01/28/2013  [facility] Patient Information 2014 [place], [place].   EXERCISE AND DIET:  We recommended that you start or continue a regular exercise program for good health. Regular exercise means any activity that makes your heart beat faster and makes you sweat.  We recommend exercising at least 30 minutes per day at least 3 days a week, preferably 5.  We also recommend a diet low in fat and sugar / carbohydrates.  Inactivity, poor dietary choices and obesity can cause diabetes, heart attack, stroke, and kidney damage, among others.     ALCOHOL AND SMOKING:  Women should limit their alcohol intake to no  more than 7 drinks/beers/glasses of wine (combined, not each!) per week. Moderation of alcohol intake to this level decreases your risk of breast cancer and liver damage.  ( And of course, no recreational drugs are part of a healthy lifestyle.)  Also, you should not be smoking at all or even being exposed to second hand smoke. Most people know smoking can  cause cancer, and various heart and lung diseases, but did you know it also contributes to weakening of your bones?  Aging of your skin?  Yellowing of your teeth and nails?   CALCIUM AND VITAMIN D:  Adequate intake of calcium and Vitamin D are recommended.  The recommendations for exact amounts of these supplements seem to change often, but generally speaking 600 mg of calcium (either carbonate or citrate) and 800 units of Vitamin D per day seems prudent. Certain women may benefit from higher intake of Vitamin D.  If you are among these women, your doctor will have told you during your visit.     PAP SMEARS:  Pap smears, to check for cervical cancer or precancers,  have traditionally been done yearly, although recent scientific advances have shown that most women can have pap smears less often.  However, every woman still should have a physical exam from her gynecologist or primary care physician every year. It will include a breast check, inspection of the vulva and vagina to check for abnormal growths or skin changes, a visual exam of the cervix, and then an exam to evaluate the size and shape of the uterus and ovaries.  And after 22 years of age, a rectal exam is indicated to check for rectal cancers. We will also provide age appropriate advice regarding health maintenance, like when you should have certain vaccines, screening for sexually transmitted diseases, bone density testing, colonoscopy, mammograms, etc.    MAMMOGRAMS:  All women over 65 years old should have a yearly mammogram. Many facilities now offer a "3D" mammogram, which may cost  around $50 extra out of pocket. If possible,  we recommend you accept the option to have the 3D mammogram performed.  It both reduces the number of women who will be called back for extra views which then turn out to be normal, and it is better than the routine mammogram at detecting truly abnormal areas.     COLONOSCOPY:  Colonoscopy to screen for colon cancer is recommended for all women at age 26.  We know, you hate the idea of the prep.  We agree, BUT, having colon cancer and not knowing it is worse!!  Colon cancer so often starts as a polyp that can be seen and removed at colonscopy, which can quite literally save your life!  And if your first colonoscopy is normal and you have no family history of colon cancer, most women don't have to have it again for 10 years.  Once every ten years, you can do something that may end up saving your life, right?  We will be happy to help you get it scheduled when you are ready.  Be sure to check your insurance coverage so you understand how much it will cost.  It may be covered as a preventative service at no cost, but you should check your particular policy.    Mediterranean Diet A Mediterranean diet refers to food and lifestyle choices that are based on the traditions of countries located on the [employer]. This way of eating has been shown to help prevent certain conditions and improve outcomes for people who have chronic diseases, like kidney disease and heart disease. What are tips for following this plan? Lifestyle  Cook and eat meals together with your family, when possible.  Drink enough fluid to keep your urine clear or pale yellow.  Be physically active every day. This includes: ? Aerobic exercise like running or swimming. ? Leisure activities like  gardening, walking, or housework.  Get 7-8 hours of sleep each night.  If recommended by your health care provider, drink red wine in moderation. This means 1 glass a day for nonpregnant women  and 2 glasses a day for men. A glass of wine equals 5 oz (150 mL). Reading food labels  Check the serving size of packaged foods. For foods such as rice and pasta, the serving size refers to the amount of cooked product, not dry.  Check the total fat in packaged foods. Avoid foods that have saturated fat or trans fats.  Check the ingredients list for added sugars, such as corn syrup. Shopping  At the grocery store, buy most of your food from the areas near the walls of the store. This includes: ? Fresh fruits and vegetables (produce). ? Grains, beans, nuts, and seeds. Some of these may be available in unpackaged forms or large amounts (in bulk). ? Fresh seafood. ? Poultry and eggs. ? Low-fat dairy products.  Buy whole ingredients instead of prepackaged foods.  Buy fresh fruits and vegetables in-season from local farmers markets.  Buy frozen fruits and vegetables in resealable bags.  If you do not have access to quality fresh seafood, buy precooked frozen shrimp or canned fish, such as tuna, salmon, or sardines.  Buy small amounts of raw or cooked vegetables, salads, or olives from the deli or salad bar at your store.  Stock your pantry so you always have certain foods on hand, such as olive oil, canned tuna, canned tomatoes, rice, pasta, and beans. Cooking  Cook foods with extra-virgin olive oil instead of using butter or other vegetable oils.  Have meat as a side dish, and have vegetables or grains as your main dish. This means having meat in small portions or adding small amounts of meat to foods like pasta or stew.  Use beans or vegetables instead of meat in common dishes like chili or lasagna.  Experiment with different cooking methods. Try roasting or broiling vegetables instead of steaming or sauteing them.  Add frozen vegetables to soups, stews, pasta, or rice.  Add nuts or seeds for added healthy fat at each meal. You can add these to yogurt, salads, or vegetable  dishes.  Marinate fish or vegetables using olive oil, lemon juice, garlic, and fresh herbs. Meal planning  Plan to eat 1 vegetarian meal one day each week. Try to work up to 2 vegetarian meals, if possible.  Eat seafood 2 or more times a week.  Have healthy snacks readily available, such as: ? Vegetable sticks with hummus. ? [place] yogurt. ? Fruit and nut trail mix.  Eat balanced meals throughout the week. This includes: ? Fruit: 2-3 servings a day ? Vegetables: 4-5 servings a day ? Low-fat dairy: 2 servings a day ? Fish, poultry, or lean meat: 1 serving a day ? Beans and legumes: 2 or more servings a week ? Nuts and seeds: 1-2 servings a day ? Whole grains: 6-8 servings a day ? Extra-virgin olive oil: 3-4 servings a day  Limit red meat and sweets to only a few servings a month What are my food choices?  Mediterranean diet ? Recommended ? Grains: Whole-grain pasta. Brown rice. Bulgar wheat. Polenta. Couscous. Whole-wheat bread. [place]. ? Vegetables: Artichokes. Beets. Broccoli. Cabbage. Carrots. Eggplant. Green beans. Chard. Kale. Spinach. Onions. Leeks. Peas. Squash. Tomatoes. Peppers. Radishes. ? Fruits: Apples. Apricots. Avocado. Berries. Bananas. Cherries. Dates. Figs. Grapes. Lemons. Melon. Oranges. Peaches. Plums. Pomegranate. ? Meats and other protein  foods: Beans. Almonds. Sunflower seeds. Pine nuts. Peanuts. [place]. Salmon. Scallops. Shrimp. [place]. Tilapia. Clams. Oysters. Eggs. ? Dairy: Low-fat milk. Cheese. Greek yogurt. ? Beverages: Water. Red wine. Herbal tea. ? Fats and oils: Extra virgin olive oil. Avocado oil. Grape seed oil. ? Sweets and desserts: [place] yogurt with honey. Baked apples. Poached pears. Trail mix. ? Seasoning and other foods: Basil. Cilantro. Coriander. Cumin. Mint. Parsley. Sage. Rosemary. Tarragon. Garlic. Oregano. Thyme. Pepper. Balsalmic vinegar. Tahini. Hummus. Tomato sauce. Olives. Mushrooms. ? Limit these ? Grains: Prepackaged pasta or  rice dishes. Prepackaged cereal with added sugar. ? Vegetables: Deep fried potatoes (french fries). ? Fruits: Fruit canned in syrup. ? Meats and other protein foods: Beef. Pork. Lamb. Poultry with skin. Hot dogs. Berniece Salines. ? Dairy: Ice cream. Sour cream. Whole milk. ? Beverages: Juice. Sugar-sweetened soft drinks. Beer. Liquor and spirits. ? Fats and oils: Butter. Canola oil. Vegetable oil. Beef fat (tallow). Lard. ? Sweets and desserts: Cookies. Cakes. Pies. Candy. ? Seasoning and other foods: Mayonnaise. Premade sauces and marinades. ? The items listed may not be a complete list. Talk with your dietitian about what dietary choices are right for you. Summary  The Mediterranean diet includes both food and lifestyle choices.  Eat a variety of fresh fruits and vegetables, beans, nuts, seeds, and whole grains.  Limit the amount of red meat and sweets that you eat.  Talk with your health care provider about whether it is safe for you to drink red wine in moderation. This means 1 glass a day for nonpregnant women and 2 glasses a day for men. A glass of wine equals 5 oz (150 mL). This information is not intended to replace advice given to you by your health care provider. Make sure you discuss any questions you have with your health care provider. Document Released: 12/01/2015 Document Revised: 01/03/2016 Document Reviewed: 12/01/2015 Elsevier Interactive Patient Education  2018 [employer].   Continue medications as directed- I sent refills in.  Once you establish with a psychiatrist please have them refill the venlafaxine and Abilify. Increase water intake, strive for at least 75 ounces/day.   Follow Mediterranean Diet.  Increase regular exercise.  Recommend at least 30 minutes daily, 5 days per week of walking, jogging, biking, swimming, YouTube/Pinterest workout videos. Overall your labs looks good, try to reduce saturated fat. Recommend annual physical with fasting labs. HAVE A GREAT  SCHOOL YEAR!

## 2018-01-17 LAB — CYTOLOGY - PAP
Candida vaginitis: NEGATIVE
Chlamydia: NEGATIVE
Diagnosis: NEGATIVE
Neisseria Gonorrhea: NEGATIVE
TRICH (WINDOWPATH): NEGATIVE

## 2018-03-11 ENCOUNTER — Ambulatory Visit (HOSPITAL_COMMUNITY): Payer: Self-pay | Admitting: Licensed Clinical Social Worker

## 2018-03-13 ENCOUNTER — Ambulatory Visit (INDEPENDENT_AMBULATORY_CARE_PROVIDER_SITE_OTHER): Payer: Self-pay | Admitting: Licensed Clinical Social Worker

## 2018-03-13 DIAGNOSIS — F603 Borderline personality disorder: Secondary | ICD-10-CM
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
## 2018-05-14 ENCOUNTER — Encounter (HOSPITAL_COMMUNITY): Payer: Self-pay | Admitting: Psychiatry

## 2018-05-14 ENCOUNTER — Ambulatory Visit (INDEPENDENT_AMBULATORY_CARE_PROVIDER_SITE_OTHER): Payer: BC Managed Care – PPO | Admitting: Psychiatry

## 2018-05-14 VITALS — BP 116/65 | HR 78 | Ht 63.5 in | Wt 310.0 lb

## 2018-05-14 DIAGNOSIS — F603 Borderline personality disorder: Secondary | ICD-10-CM | POA: Diagnosis not present

## 2018-05-14 DIAGNOSIS — F319 Bipolar disorder, unspecified: Secondary | ICD-10-CM | POA: Diagnosis not present

## 2018-05-14 DIAGNOSIS — F4310 Post-traumatic stress disorder, unspecified: Secondary | ICD-10-CM | POA: Diagnosis not present

## 2018-05-14 MED ORDER — LAMOTRIGINE 25 MG PO TABS: ORAL_TABLET | ORAL | 1 refills | Status: DC

## 2018-05-14 MED ORDER — ARIPIPRAZOLE 2 MG PO TABS: 2.0000 mg | ORAL_TABLET | Freq: Every day | ORAL | 0 refills | Status: DC

## 2018-05-14 MED ORDER — VENLAFAXINE HCL ER 37.5 MG PO CP24: 37.5000 mg | ORAL_CAPSULE | Freq: Every day | ORAL | 0 refills | Status: DC
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
## 2018-06-07 NOTE — Progress Notes (Addendum)
Subjective:    Patient ID: Anna [name], female    DOB: 05-24-1995, 23 y.o.   MRN: [id number]  HPI:  Anna [name] presents with several issues: 1) Would like to stop oral birth control and change over to Nexplanon, due to frequently forgetting to take daily pill. Referral to GYN placed. She denies tobacco use, she quite vape use one week ago- great! 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 She denies any other acute incident or injury. Since then she reports constant 6/10-8/10 thoracic back paint that is described as "sharp, pressure" She completed full course of PT in 2015- in [place] somewhere, records not in system She reports still performing home PT and additional exercising and still experiencing pain. She denies change in bowel/bladder habits. She denies numbness/tingling in hands or feet. She has never had xray or MRI of back Discussed that body habitus can contribute to back pain, current wt is 312, Body mass index is 54.83 kg/m.  Patient Care Team    Relationship Specialty Notifications Start End  Anna Grandchild, NP PCP - General Family Medicine  11/20/17     Patient Active Problem List   Diagnosis Date Noted  . Thoracic back pain 06/09/2018  . Birth control counseling 06/09/2018  . Depression, recurrent ([place]) 11/20/2017  . H/O borderline personality disorder 11/20/2017  . Healthcare maintenance 11/20/2017  . Anxiety 11/20/2017  . Morbid obesity ([place]) 11/20/2017  . Borderline personality disorder ([place]) 11/20/2017     Past Medical History:  Diagnosis Date  . Anxiety   . Borderline personality disorder ([place])   . Depression   . Enlarged heart   . Pneumonia   . PTSD (post-traumatic stress disorder)      History reviewed. No pertinent surgical history.   Family History  Problem  Relation Age of Onset  . Clotting disorder Mother   . Cancer Father   . COPD Father        skin  . Depression Father   . Healthy Sister   . Healthy Brother   . Cancer Maternal Grandmother        breast     Social History   Substance and Sexual Activity  Drug Use Not Currently   Comment: 2017 Marijuana     Social History   Substance and Sexual Activity  Alcohol Use Yes   Comment: occassionally     Social History   Tobacco Use  Smoking Status Former Smoker  . Packs/day: 0.25  . Years: 3.00  . Pack years: 0.75  . Types: Cigarettes  . Last attempt to quit: 10/21/2017  . Years since quitting: 0.6  Smokeless Tobacco Never Used     Outpatient Encounter Medications as of 06/09/2018  Medication Sig  . ARIPiprazole (ABILIFY) 2 MG tablet Take 1 tablet (2 mg total) by mouth daily.  Mar[place] Kitchen lamoTRIgine (LAMICTAL) 25 MG tablet Take one tab daily for 1 week and than 2 tab daily for 1 week and than 3 tab daily  . SPRINTEC 28 0.25-35 MG-MCG tablet   . venlafaxine XR (EFFEXOR-XR) 37.5 MG 24 hr capsule Take 1 capsule (37.5 mg total) by mouth daily.  . cyclobenzaprine (FLEXERIL) 10 MG tablet Take 1 tablet (10 mg total) by mouth 3 (three) times daily as needed for muscle spasms.   No facility-administered encounter  medications on file as of 06/09/2018.     Allergies: Patient has no known allergies.  Body mass index is 54.83 kg/m.  Blood pressure 110/71, pulse 84, temperature 98.4 F (36.9 C), temperature source Oral, height 5' 3.25" (1.607 m), weight (!) 312 lb (141.5 kg), last menstrual period 05/26/2018, SpO2 97 %. Review of Systems  Constitutional: Positive for activity change and fatigue. Negative for appetite change, chills, diaphoresis, fever and unexpected weight change.  Respiratory: Negative for cough, chest tightness, shortness of breath and stridor.   Cardiovascular: Negative for chest pain, palpitations and leg swelling.  Gastrointestinal: Negative for abdominal  distention, abdominal pain, blood in stool, constipation, diarrhea, nausea and vomiting.  Endocrine: Negative for cold intolerance, polydipsia, polyphagia and polyuria.  Genitourinary: Negative for difficulty urinating and flank pain.  Musculoskeletal: Positive for arthralgias, back pain and myalgias. Negative for gait problem, joint swelling, neck pain and neck stiffness.  Neurological: Positive for headaches. Negative for dizziness, syncope and weakness.  Psychiatric/Behavioral: Positive for agitation and dysphoric mood. Negative for behavioral problems, confusion, decreased concentration, hallucinations, self-injury, sleep disturbance and suicidal ideas. The patient is nervous/anxious. The patient is not hyperactive.        Objective:   Physical Exam Constitutional:      General: She is not in acute distress.    Appearance: She is obese. She is not ill-appearing, toxic-appearing or diaphoretic.  HENT:     Head: Normocephalic and atraumatic.  Cardiovascular:     Rate and Rhythm: Normal rate.     Pulses: Normal pulses.     Heart sounds: Normal heart sounds. No murmur. No friction rub. No gallop.   Pulmonary:     Effort: Pulmonary effort is normal. No respiratory distress.     Breath sounds: Normal breath sounds. No stridor. No wheezing, rhonchi or rales.  Chest:     Chest wall: No tenderness.  Musculoskeletal:        General: Tenderness present. No deformity or signs of injury.     Cervical back: Normal.     Thoracic back: She exhibits tenderness, pain and spasm. She exhibits normal range of motion, no bony tenderness and no edema.     Lumbar back: Normal.     Right lower leg: No edema.     Left lower leg: No edema.  Skin:    General: Skin is warm and dry.     Capillary Refill: Capillary refill takes less than 2 seconds.  Neurological:     Mental Status: She is alert and oriented to person, place, and time.  Psychiatric:        Attention and Perception: Attention normal.         Mood and Affect: Affect is blunt and flat.        Speech: Speech normal.        Behavior: Behavior normal.        Thought Content: Thought content normal.        Cognition and Memory: Cognition and memory normal.        Judgment: Judgment normal.       Assessment & Plan:   1. Birth control counseling   2. Thoracic back pain, unspecified back pain laterality, unspecified chronicity   3. Morbid obesity ([place])     Thoracic back pain Xray- IMPRESSION: Negative. Continue to perform home Physical Therapy Exercises and exercise as tolerated. Since you have completed course of Physical Therapy and are still experiencing symptoms, referral to Orthopedic Specialist. Use Cyclobenzaprine sparingly- recommend only  at night due to possible sedation. Recommend heart healthy diet, weight loss can be helpful in reducing back pain.   Birth control counseling Referral placed to OB/GYN, re: Nexplanon for birth control. Remain off all tobacco/vape products- YOU CAN DO IT!  Morbid obesity ([place]) Body mass index is 54.83 kg/m.  Current wt 312 Continue to perform home Physical Therapy Exercises and exercise as tolerated. Since you have completed course of Physical Therapy and are still experiencing symptoms, referral to Orthopedic Specialist. Recommend heart healthy diet, weight loss can be helpful in reducing back pain.  Pt was in the office today for 25+ minutes,I spent >75% of  time in face to face counseling of patient's various medical conditions and in coordination of care   FOLLOW-UP:  Return if symptoms worsen or fail to improve.

## 2018-06-09 ENCOUNTER — Ambulatory Visit: Payer: BC Managed Care – PPO

## 2018-06-09 ENCOUNTER — Ambulatory Visit (INDEPENDENT_AMBULATORY_CARE_PROVIDER_SITE_OTHER): Payer: BC Managed Care – PPO | Admitting: Adult Health

## 2018-06-09 ENCOUNTER — Encounter: Payer: Self-pay | Admitting: Adult Health

## 2018-06-09 VITALS — BP 110/71 | HR 84 | Temp 98.4°F | Ht 63.25 in | Wt 312.0 lb

## 2018-06-09 DIAGNOSIS — M546 Pain in thoracic spine: Secondary | ICD-10-CM | POA: Insufficient documentation

## 2018-06-09 DIAGNOSIS — Z3009 Encounter for other general counseling and advice on contraception: Secondary | ICD-10-CM | POA: Insufficient documentation

## 2018-06-09 MED ORDER — CYCLOBENZAPRINE HCL 10 MG PO TABS: 10.0000 mg | ORAL_TABLET | Freq: Three times a day (TID) | ORAL | 0 refills | Status: DC | PRN

## 2018-06-09 NOTE — Assessment & Plan Note (Signed)
Referral placed to OB/GYN, re: Nexplanon for birth control. Remain off all tobacco/vape products- YOU CAN DO IT!

## 2018-06-09 NOTE — Patient Instructions (Addendum)
Thoracic Strain  A thoracic strain, which is sometimes called a mid-back strain, is an injury to the muscles or tendons that attach to the upper part of your back behind your chest. This type of injury occurs when a muscle is overstretched or overloaded. Thoracic strains can range from mild to severe. Mild strains may involve stretching a muscle or tendon without tearing it. These injuries may heal in 1-2 weeks. More severe strains involve tearing of muscle fibers or tendons. These will cause more pain and may take 6-8 weeks to heal. What are the causes? This condition may be caused by:  An injury in which a sudden force is placed on the muscle.  Exercising without properly warming up.  Overuse of the muscle.  Improper form during certain movements.  Other injuries that surround or cause stress on the mid-back, causing a strain on the muscles. In some cases, the cause may not be known. What increases the risk? This injury is more common in:  Athletes.  People with obesity. What are the signs or symptoms? The main symptom of this condition is pain, especially with movement. Other symptoms include:  Bruising.  Swelling.  Spasm. How is this diagnosed? This condition may be diagnosed with a physical exam. X-rays may be taken to check for a fracture. How is this treated? This condition may be treated with:  Resting and icing the injured area.  Physical therapy. This will involve doing stretching and strengthening exercises.  Medicines for pain and inflammation. Follow these instructions at home:  Rest as needed. Follow instructions from your health care provider about any restrictions on activity.  If directed, apply ice to the injured area: ? Put ice in a plastic bag. ? Place a towel between your skin and the bag. ? Leave the ice on for 20 minutes, 2-3 times per day.  Take over-the-counter and prescription medicines only as told by your health care provider.  Begin  doing exercises as told by your health care provider or physical therapist.  Always warm up properly before physical activity or sports.  Bend your knees before you lift heavy objects.  Keep all follow-up visits as told by your health care provider. This is important. Contact a health care provider if:  Your pain is not helped by medicine.  Your pain, bruising, or swelling is getting worse.  You have a fever. Get help right away if:  You have shortness of breath.  You have chest pain.  You develop numbness or weakness in your legs.  You have involuntary loss of urine (urinary incontinence). This information is not intended to replace advice given to you by your health care provider. Make sure you discuss any questions you have with your health care provider. Document Released: 06/30/2003 Document Revised: 12/10/2015 Document Reviewed: 06/03/2014 Elsevier Interactive Patient Education  2019 [employer].   Continue to perform home Physical Therapy Exercises and exercise as tolerated. Since you have completed course of Physical Therapy and are still experiencing symptoms, referral to Orthopedic Specialist. Use Cyclobenzaprine sparingly- recommend only at night due to possible sedation. Recommend heart healthy diet, weight loss can be helpful in reducing back pain. Referral placed to OB/GYN, re: Nexplanon for birth control. Continue with Behavioral Health as directed. Remain off all tobacco/vape products- YOU CAN DO IT! Follow-up as needed. FEEL BETTER!

## 2018-06-09 NOTE — Assessment & Plan Note (Addendum)
Xray- IMPRESSION: Negative. Continue to perform home Physical Therapy Exercises and exercise as tolerated. Since you have completed course of Physical Therapy and are still experiencing symptoms, referral to Orthopedic Specialist. Use Cyclobenzaprine sparingly- recommend only at night due to possible sedation. Recommend heart healthy diet, weight loss can be helpful in reducing back pain.

## 2018-06-09 NOTE — Assessment & Plan Note (Addendum)
Body mass index is 54.83 kg/m.  Current wt 312 Continue to perform home Physical Therapy Exercises and exercise as tolerated. Since you have completed course of Physical Therapy and are still experiencing symptoms, referral to Orthopedic Specialist. Recommend heart healthy diet, weight loss can be helpful in reducing back pain.

## 2018-06-11 ENCOUNTER — Ambulatory Visit (INDEPENDENT_AMBULATORY_CARE_PROVIDER_SITE_OTHER): Payer: BC Managed Care – PPO | Admitting: Family Medicine

## 2018-06-11 ENCOUNTER — Encounter (INDEPENDENT_AMBULATORY_CARE_PROVIDER_SITE_OTHER): Payer: Self-pay | Admitting: Family Medicine

## 2018-06-11 DIAGNOSIS — G8929 Other chronic pain: Secondary | ICD-10-CM | POA: Diagnosis not present

## 2018-06-11 DIAGNOSIS — M546 Pain in thoracic spine: Secondary | ICD-10-CM

## 2018-06-11 MED ORDER — VITAMIN D-3 125 MCG (5000 UT) PO TABS: 1.0000 | ORAL_TABLET | Freq: Every day | ORAL | 3 refills | Status: DC

## 2018-06-11 NOTE — Progress Notes (Signed)
Office Visit Note   Patient: Anna [name]           Date of Birth: 1995/05/06           MRN: [id number] Visit Date: 06/11/2018 Requested by: Esaw Grandchild, NP [place], [place] [address] PCP: Esaw Grandchild, NP  Subjective: Chief Complaint  Patient presents with  . Middle Back - Pain    Pain x 7 years (constant). Unsure of any specific injury, but says she has memory - " it started when she was in the band in high school."    HPI: She is a 23 year old with thoracic back pain.  Symptoms started probably 6 or 7 years ago, she is not sure of the exact event but it happened while she was participating in marching band.  She has had pain ever since then.  She went to physical therapy for quite a while about 4 years ago and is still doing the home exercises.  It really did not help.  Pain does not radiate into the arms or legs, denies any bowel or bladder dysfunction.  It is a fairly constant pain when she is active during the day.  She does not take medications for her pain.  She has morbid obesity and is trying to lose weight with a low carbohydrate diet but in the past 2 years has not been very successful at weight loss.  She knows it contributes to her pain and is frustrated by her lack of results.  No family history of rheumatologic disease.  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              ROS: She has depression, borderline personality, anxiety.  She is managed with medications for these things.  Other systems were reviewed and are negative.  Objective: Vital Signs: LMP 05/26/2018 (Approximate)   Physical Exam:  Thoracic spine: No visible scoliosis, good flexibility with forward flexion and hyperextension.  Diffuse tenderness in the midthoracic spine along the spinous processes and paraspinous muscles.  She has multiple trigger points in this area.  Lower extremity strength and reflexes are normal.  Imaging: None today.  Recent x-rays were reviewed and are  unremarkable.  Assessment & Plan: 1.  Chronic thoracic back pain, possibly muscular but cannot completely rule out disc protrusion.  Failed conservative management so far. -MRI to evaluate.  Based on the results we might try chiropractic, or possibly even epidural steroid injection if indicated. -We will also check her vitamin D level and treat accordingly.  We will also check thyroid antibodies to see whether she might have Hashimoto's affecting her ability to lose weight.   Follow-Up Instructions: No follow-ups on file.      Procedures: No procedures performed  No notes on file    PMFS History: Patient Active Problem List   Diagnosis Date Noted  . Thoracic back pain 06/09/2018  . Birth control counseling 06/09/2018  . Depression, recurrent ([place]) 11/20/2017  . H/O borderline personality disorder 11/20/2017  . Healthcare maintenance 11/20/2017  . Anxiety 11/20/2017  . Morbid obesity ([place]) 11/20/2017  . Borderline personality disorder ([place]) 11/20/2017   Past Medical History:  Diagnosis Date  . Anxiety   . Borderline personality disorder ([place])   . Depression   . Enlarged heart   . Pneumonia   . PTSD (post-traumatic stress disorder)     Family History  Problem Relation Age of Onset  . Clotting disorder Mother   . Cancer Father   .  COPD Father        skin  . Depression Father   . Healthy Sister   . Healthy Brother   . Cancer Maternal Grandmother        breast    History reviewed. No pertinent surgical history. Social History   Occupational History  . Not on file  Tobacco Use  . Smoking status: Former Smoker    Packs/day: 0.25    Years: 3.00    Pack years: 0.75    Types: Cigarettes    Last attempt to quit: 10/21/2017    Years since quitting: 0.6  . Smokeless tobacco: Never Used  Substance and Sexual Activity  . Alcohol use: Yes    Comment: occassionally  . Drug use: Not Currently    Comment: 2017 Marijuana  . Sexual activity: Not Currently    Birth  control/protection: None, Pill

## 2018-06-12 ENCOUNTER — Telehealth (INDEPENDENT_AMBULATORY_CARE_PROVIDER_SITE_OTHER): Payer: Self-pay | Admitting: Family Medicine

## 2018-06-12 LAB — VITAMIN D 25 HYDROXY (VIT D DEFICIENCY, FRACTURES): Vit D, 25-Hydroxy: 23 ng/mL — ABNORMAL LOW (ref 30–100)

## 2018-06-12 LAB — THYROID PEROXIDASE ANTIBODY: Thyroperoxidase Ab SerPl-aCnc: 2 IU/mL (ref <9)

## 2018-06-12 NOTE — Telephone Encounter (Signed)
Thyroid antibodies were negative/normal.   

## 2018-06-12 NOTE — Telephone Encounter (Signed)
Vitamin D is low at 23.  We want this to be 50-80.  I recommend taking OTC vitamin D3 at 5,000 IU daily.  Recheck in 4-6 months.  One more result still pending.

## 2018-06-13 ENCOUNTER — Other Ambulatory Visit: Payer: Self-pay

## 2018-06-13 ENCOUNTER — Encounter: Payer: Self-pay | Admitting: Obstetrics and Gynecology

## 2018-06-13 ENCOUNTER — Ambulatory Visit: Payer: BC Managed Care – PPO | Admitting: Obstetrics and Gynecology

## 2018-06-13 VITALS — BP 130/76 | HR 80 | Resp 16 | Ht 64.5 in | Wt 311.0 lb

## 2018-06-13 DIAGNOSIS — Z3009 Encounter for other general counseling and advice on contraception: Secondary | ICD-10-CM | POA: Diagnosis not present

## 2018-06-13 NOTE — Telephone Encounter (Signed)
I called patient's mobile - voice mailbox has not been set up. Will try again another time.
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
## 2018-06-13 NOTE — Patient Instructions (Signed)
Etonogestrel implant  What is this medicine?  ETONOGESTREL (et oh noe JES trel) is a contraceptive (birth control) device. It is used to prevent pregnancy. It can be used for up to 3 years.  This medicine may be used for other purposes; ask your health care provider or pharmacist if you have questions.  COMMON BRAND NAME(S): Implanon, Nexplanon  What should I tell my health care provider before I take this medicine?  They need to know if you have any of these conditions:  -abnormal vaginal bleeding  -blood vessel disease or blood clots  -breast, cervical, endometrial, ovarian, liver, or uterine cancer  -diabetes  -gallbladder disease  -heart disease or recent heart attack  -high blood pressure  -high cholesterol or triglycerides  -kidney disease  -liver disease  -migraine headaches  -seizures  -stroke  -tobacco smoker  -an unusual or allergic reaction to etonogestrel, anesthetics or antiseptics, other medicines, foods, dyes, or preservatives  -pregnant or trying to get pregnant  -breast-feeding  How should I use this medicine?  This device is inserted just under the skin on the inner side of your upper arm by a health care professional.  Talk to your pediatrician regarding the use of this medicine in children. Special care may be needed.  Overdosage: If you think you have taken too much of this medicine contact a poison control center or emergency room at once.  NOTE: This medicine is only for you. Do not share this medicine with others.  What if I miss a dose?  This does not apply.  What may interact with this medicine?  Do not take this medicine with any of the following medications:  -amprenavir  -fosamprenavir  This medicine may also interact with the following medications:  -acitretin  -aprepitant  -armodafinil  -bexarotene  -bosentan  -carbamazepine  -certain medicines for fungal infections like fluconazole, ketoconazole, itraconazole and voriconazole  -certain medicines to treat hepatitis, HIV or  AIDS  -cyclosporine  -felbamate  -griseofulvin  -lamotrigine  -modafinil  -oxcarbazepine  -phenobarbital  -phenytoin  -primidone  -rifabutin  -rifampin  -rifapentine  -St. John's wort  -topiramate  This list may not describe all possible interactions. Give your health care provider a list of all the medicines, herbs, non-prescription drugs, or dietary supplements you use. Also tell them if you smoke, drink alcohol, or use illegal drugs. Some items may interact with your medicine.  What should I watch for while using this medicine?  This product does not protect you against HIV infection (AIDS) or other sexually transmitted diseases.  You should be able to feel the implant by pressing your fingertips over the skin where it was inserted. Contact your doctor if you cannot feel the implant, and use a non-hormonal birth control method (such as condoms) until your doctor confirms that the implant is in place. Contact your doctor if you think that the implant may have broken or become bent while in your arm.  You will receive a user card from your health care provider after the implant is inserted. The card is a record of the location of the implant in your upper arm and when it should be removed. Keep this card with your health records.  What side effects may I notice from receiving this medicine?  Side effects that you should report to your doctor or health care professional as soon as possible:  -allergic reactions like skin rash, itching or hives, swelling of the face, lips, or tongue  -breast lumps, breast tissue   changes, or discharge  -breathing problems  -changes in emotions or moods  -if you feel that the implant may have broken or bent while in your arm  -high blood pressure  -pain, irritation, swelling, or bruising at the insertion site  -scar at site of insertion  -signs of infection at the insertion site such as fever, and skin redness, pain or discharge  -signs and symptoms of a blood clot such as breathing  problems; changes in vision; chest pain; severe, sudden headache; pain, swelling, warmth in the leg; trouble speaking; sudden numbness or weakness of the face, arm or leg  -signs and symptoms of liver injury like dark yellow or brown urine; general ill feeling or flu-like symptoms; light-colored stools; loss of appetite; nausea; right upper belly pain; unusually weak or tired; yellowing of the eyes or skin  -unusual vaginal bleeding, discharge  Side effects that usually do not require medical attention (report to your doctor or health care professional if they continue or are bothersome):  -acne  -breast pain or tenderness  -headache  -irregular menstrual bleeding  -nausea  This list may not describe all possible side effects. Call your doctor for medical advice about side effects. You may report side effects to FDA at 1-800-FDA-1088.  Where should I keep my medicine?  This drug is given in a hospital or clinic and will not be stored at home.  NOTE: This sheet is a summary. It may not cover all possible information. If you have questions about this medicine, talk to your doctor, pharmacist, or health care provider.   2019 Elsevier/Gold Standard (2017-02-26 14:11:42)

## 2018-06-17 ENCOUNTER — Telehealth: Payer: Self-pay | Admitting: Obstetrics and Gynecology

## 2018-06-17 NOTE — Telephone Encounter (Signed)
Call placed to convey benefits for nexplanon

## 2018-06-17 NOTE — Telephone Encounter (Signed)
Left this full message on the patient's voice mail - see other message on lab results from today.

## 2018-06-17 NOTE — Telephone Encounter (Signed)
Left full message on the patient's voice mail (per request on the DPR). She is to call back if any questions/concerns.

## 2018-06-20 ENCOUNTER — Ambulatory Visit (HOSPITAL_COMMUNITY): Payer: Self-pay | Admitting: Psychiatry

## 2018-06-25 ENCOUNTER — Inpatient Hospital Stay: Admission: RE | Admit: 2018-06-25 | Payer: Self-pay | Source: Ambulatory Visit

## 2018-07-07 ENCOUNTER — Other Ambulatory Visit: Payer: Self-pay

## 2018-07-16 ENCOUNTER — Encounter: Payer: Self-pay | Admitting: Obstetrics and Gynecology

## 2018-07-16 ENCOUNTER — Ambulatory Visit (INDEPENDENT_AMBULATORY_CARE_PROVIDER_SITE_OTHER): Payer: BC Managed Care – PPO | Admitting: Obstetrics and Gynecology

## 2018-07-16 ENCOUNTER — Other Ambulatory Visit: Payer: Self-pay

## 2018-07-16 VITALS — BP 110/74 | HR 70 | Temp 98.6°F | Resp 16 | Wt 316.0 lb

## 2018-07-16 DIAGNOSIS — Z01812 Encounter for preprocedural laboratory examination: Secondary | ICD-10-CM

## 2018-07-16 DIAGNOSIS — Z30017 Encounter for initial prescription of implantable subdermal contraceptive: Secondary | ICD-10-CM | POA: Diagnosis not present

## 2018-07-16 LAB — POCT URINE PREGNANCY: Preg Test, Ur: NEGATIVE

## 2018-07-16 NOTE — Patient Instructions (Signed)
Nexplanon Instructions After Insertion  Keep bandage clean and dry for 24 hours  May use ice/Tylenol/Ibuprofen for soreness or pain  If you develop fever, drainage or increased warmth from incision site-contact office immediately   

## 2018-07-16 NOTE — Progress Notes (Signed)
GYNECOLOGY  VISIT   HPI: 23 y.o.   Single White or Caucasian Not Hispanic or Latino  female   G0P0000 with No LMP recorded.   here for nexplanon insertion.  GYNECOLOGIC HISTORY: No LMP recorded. Contraception:ocps Menopausal hormone therapy: none UPT-neg        OB History    Gravida  0   Para  0   Term  0   Preterm  0   AB  0   Living  0     SAB  0   TAB  0   Ectopic  0   Multiple  0   Live Births  0              Patient Active Problem List   Diagnosis Date Noted  . Thoracic back pain 06/09/2018  . Birth control counseling 06/09/2018  . Depression, recurrent ([place]) 11/20/2017  . H/O borderline personality disorder 11/20/2017  . Healthcare maintenance 11/20/2017  . Anxiety 11/20/2017  . Morbid obesity ([place]) 11/20/2017  . Borderline personality disorder ([place]) 11/20/2017    Past Medical History:  Diagnosis Date  . Anxiety   . Borderline personality disorder ([place])   . Depression   . Enlarged heart   . Pneumonia   . PTSD (post-traumatic stress disorder)     No past surgical history on file.  Current Outpatient Medications  Medication Sig Dispense Refill  . ARIPiprazole (ABILIFY) 2 MG tablet Take 1 tablet (2 mg total) by mouth daily. 90 tablet 0  . Cholecalciferol (VITAMIN D-3) 125 MCG (5000 UT) TABS Take 1 tablet by mouth daily. 90 tablet 3  . cyclobenzaprine (FLEXERIL) 10 MG tablet Take 1 tablet (10 mg total) by mouth 3 (three) times daily as needed for muscle spasms. 30 tablet 0  . SPRINTEC 28 0.25-35 MG-MCG tablet     . venlafaxine XR (EFFEXOR-XR) 37.5 MG 24 hr capsule Take 1 capsule (37.5 mg total) by mouth daily. 90 capsule 0   No current facility-administered medications for this visit.      ALLERGIES: Patient has no known allergies.  Family History  Problem Relation Age of Onset  . Clotting disorder Mother   . Cancer Father   . COPD Father        skin  . Depression Father   . Healthy Sister   . Healthy Brother   . Cancer  Maternal Grandmother        breast  . Colon cancer Paternal Grandmother     Social History   Socioeconomic History  . Marital status: Single    Spouse name: Not on file  . Number of children: 0  . Years of education: Not on file  . Highest education level: Not on file  Occupational History  . Not on file  Social Needs  . Financial resource strain: Not on file  . Food insecurity:    Worry: Not on file    Inability: Not on file  . Transportation needs:    Medical: Not on file    Non-medical: Not on file  Tobacco Use  . Smoking status: Former Smoker    Packs/day: 0.25    Years: 3.00    Pack years: 0.75    Types: Cigarettes    Last attempt to quit: 10/21/2017    Years since quitting: 0.7  . Smokeless tobacco: Never Used  Substance and Sexual Activity  . Alcohol use: Yes    Comment: occassionally  . Drug use: Not Currently    Comment:  2017 Marijuana  . Sexual activity: Yes    Birth control/protection: Pill, Condom  Lifestyle  . Physical activity:    Days per week: Not on file    Minutes per session: Not on file  . Stress: Not on file  Relationships  . Social connections:    Talks on phone: Not on file    Gets together: Not on file    Attends religious service: Not on file    Active member of club or organization: Not on file    Attends meetings of clubs or organizations: Not on file    Relationship status: Not on file  . Intimate partner violence:    Fear of current or ex partner: Not on file    Emotionally abused: Not on file    Physically abused: Not on file    Forced sexual activity: Not on file  Other Topics Concern  . Not on file  Social History Narrative  . Not on file    Review of Systems  Constitutional: Negative.   HENT: Negative.   Eyes: Negative.   Respiratory: Negative.   Cardiovascular: Negative.   Gastrointestinal: Negative.   Genitourinary: Negative.   Musculoskeletal: Negative.   Skin: Negative.   Neurological: Negative.    Endo/Heme/Allergies: Negative.   Psychiatric/Behavioral: Negative.     PHYSICAL EXAMINATION:    There were no vitals taken for this visit.    General appearance: alert, cooperative and appears stated age  Risks of nexplanon removal and reinsertion were reviewed with the patient and a consent was signed.  The patient was placed in the supine position with her left arm bent at the elbow. The area was cleansed with betadine and injected with 1% lidocaine along the path where the nexplanon would be placed. The nexplanon device was inserted in the usual fashion without difficulty. Slight oozing from the insertion site was stopped with pressure. The device was palpated in place.  The patients arm was cleansed of betadine and a steri strip was placed over the incision. A gauze was wrapped around her arm.  She tolerated the procedure well  Instructions for care were discussed.     ASSESSMENT Contraception management    PLAN Nexplanon inserted Routine f/u   An After Visit Summary was printed and given to the patient.

## 2018-10-06 ENCOUNTER — Other Ambulatory Visit: Payer: Self-pay

## 2018-10-06 ENCOUNTER — Ambulatory Visit (INDEPENDENT_AMBULATORY_CARE_PROVIDER_SITE_OTHER): Payer: BC Managed Care – PPO | Admitting: Family Medicine

## 2018-10-06 ENCOUNTER — Encounter: Payer: Self-pay | Admitting: Family Medicine

## 2018-10-06 VITALS — BP 111/77 | HR 99 | Temp 98.7°F | Ht 64.5 in | Wt 321.5 lb

## 2018-10-06 DIAGNOSIS — F603 Borderline personality disorder: Secondary | ICD-10-CM | POA: Diagnosis not present

## 2018-10-06 DIAGNOSIS — F3160 Bipolar disorder, current episode mixed, unspecified: Secondary | ICD-10-CM

## 2018-10-06 DIAGNOSIS — F339 Major depressive disorder, recurrent, unspecified: Secondary | ICD-10-CM | POA: Diagnosis not present

## 2018-10-06 DIAGNOSIS — F4310 Post-traumatic stress disorder, unspecified: Secondary | ICD-10-CM | POA: Diagnosis not present

## 2018-10-06 DIAGNOSIS — F419 Anxiety disorder, unspecified: Secondary | ICD-10-CM | POA: Diagnosis not present
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
## 2018-10-06 NOTE — Patient Instructions (Signed)
Patient goals: -Walk to a goal of 10 to 20 minutes twice daily.  Brisk -Meditate using headspace app or calm app or the like, for at least 15 minutes twice daily as well. -Continue journaling and writing goals each day so that you always have something to look forward to and focus on that is positive -Also keeping a great fullness journal-every day writing down things that you are grateful for in your life can be transformative as well over time -For your medical conditions, obviously we, as family medicine providers, would not be able to treat your conditions however we need to get you in the hands of a good psychiatrist that you connect with who will be treating your conditions and also a psychologist for counseling. -To continue with psychologist every 2 weeks through [facility] and more often if possible!!  Also see about joining community support groups for counseling as well -We will continue with: [facility]/ Psychiatrist for now.   We will get you in with a female psychiatrist for future per your preference -Per your request, we will also refer you to someone outside of the system  -female psychiatrist for care as well.   However, if you get along well with the one through [facility], that would be my preference for you to continue so I can continue to monitor your progress and treatments -Follow-up 1 month

## 2018-11-05 ENCOUNTER — Other Ambulatory Visit: Payer: Self-pay

## 2018-11-05 ENCOUNTER — Ambulatory Visit (INDEPENDENT_AMBULATORY_CARE_PROVIDER_SITE_OTHER): Payer: BC Managed Care – PPO | Admitting: Family Medicine

## 2018-11-05 ENCOUNTER — Encounter: Payer: Self-pay | Admitting: Family Medicine

## 2018-11-05 VITALS — Ht 64.0 in | Wt 315.0 lb

## 2018-11-05 DIAGNOSIS — F419 Anxiety disorder, unspecified: Secondary | ICD-10-CM | POA: Diagnosis not present

## 2018-11-05 DIAGNOSIS — F4310 Post-traumatic stress disorder, unspecified: Secondary | ICD-10-CM

## 2018-11-05 DIAGNOSIS — F339 Major depressive disorder, recurrent, unspecified: Secondary | ICD-10-CM | POA: Diagnosis not present

## 2018-11-05 DIAGNOSIS — F3160 Bipolar disorder, current episode mixed, unspecified: Secondary | ICD-10-CM | POA: Diagnosis not present

## 2018-11-05 DIAGNOSIS — F603 Borderline personality disorder: Secondary | ICD-10-CM | POA: Diagnosis not present

## 2018-11-05 NOTE — Progress Notes (Signed)
Virtual / live video office visit note for [employer], D.O- Primary Care Physician at [facility]   I connected with current patient today and beyond visually recognizing the correct individual, I verified that I am speaking with the correct person using two identifiers.  . Location of the patient: Home . Location of the provider: Office Only the patient (+/- their family members at pt's discretion) and myself were participating in the encounter    - This visit type was conducted due to national recommendations for restrictions regarding the COVID-19 Pandemic (e.g. social distancing) in an effort to limit this patient's exposure and mitigate transmission in our community.  This format is felt to be most appropriate for this patient at this time.   - The patient did have access to video technology today    - No physical exam could be performed with this format, beyond that communicated to [place] by the patient/ family members as noted.   - Additionally my office staff/ schedulers discussed with the patient that there may be a monetary charge related to this service, depending on patient's medical insurance.   The patient expressed understanding, and agreed to proceed.      History of Present Illness:   - "Counting down hours for when I can get back into my dorm at [employer]"    - pt tried meeting her goals - did initially but has not been doing anything much since.     - Pt isolates herself from family- Dad encourages her to come down to chat.     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  She journals and draws.  - She was referred to psychiatry for med mgt at [facility]- has upcoming appt- Has appt tomorrow!!    Dr Toy Care  - Not doing a gratefulness journal.   - Wrote down goals for school year.       Return in about 4 weeks (around 11/03/2018), or f/up 4 wks- goals: walking, journaling and meditation, for Referred to female psychologist-behavhealth and follow-up with female psychiatrist for  med mgt. Patient goals: -Walk to a goal of 10 to 20 minutes twice daily.  Brisk -Meditate using headspace app or calm app or the like, for at least 15 minutes twice daily as well. -Continue journaling and writing goals each day so that you always have something to look forward to and focus on that is positive -Also keeping a great fullness journal-every day writing down things that you are grateful for in your life can be transformative as well over time -For your medical conditions, obviously we, as family medicine providers, would not be able to treat your conditions however we need to get you in the hands of a good psychiatrist that you connect with who will be treating your conditions and also a psychologist for counseling. -To continue with psychologist every 2 weeks through [facility] and more often if possible!!  Also see about joining community support groups for counseling as well -We will continue with: [facility]/ Psychiatrist for now.   We will get you in with a female psychiatrist for future per your preference -Per your request, we will also refer you to someone outside of the system  -female psychiatrist for care as well.   However, if you get along well with the one through [facility], that would be my preference for you to continue so I can continue to monitor your progress and treatments -Follow-up 1 month  Impression and Recommendations:    No diagnosis found.    - As part of my medical decision making, I reviewed the following data within the [place] History obtained from pt /family, CMA notes reviewed and incorporated if applicable, Labs reviewed, Radiograph/ tests reviewed if applicable and OV notes from prior OV's with me, as well as other specialists she/he has seen since seeing me last, were all reviewed and used in my medical decision making process today.   - Additionally, discussion had with patient regarding txmnt plan, their biases about  that plan etc were used in my medical decision making today.   - The patient agreed with the plan and demonstrated an understanding of the instructions.   No barriers to understanding were identified.   - Red flag symptoms and signs discussed in detail.  Patient expressed understanding regarding what to do in case of emergency\ urgent symptoms.  The patient was advised to call back or seek an in-person evaluation if the symptoms worsen or if the condition fails to improve as anticipated.   Return for f/up in Sept after Psychiatry referral and new meds started. .   I provided 28 minutes of non-face-to-face time during this encounter,with over 50% of the time in direct counseling on patients medical conditions/ medical concerns.  Additional time was spent with charting and coordination of care after the actual visit commenced.   Note:  This note was prepared with assistance of Dragon voice recognition software. Occasional wrong-word or sound-a-like substitutions may have occurred due to the inherent limitations of voice recognition software.  Mellody Dance, DO     Patient Care Team    Relationship Specialty Notifications Start End  Mellody Dance, DO PCP - General Family Medicine  10/06/18     -Vitals obtained; medications/ allergies reconciled;  personal medical, social, Sx etc.histories were updated by CMA, reviewed by me and are reflected in chart  Patient Active Problem List   Diagnosis Date Noted  . PTSD (post-traumatic stress disorder)   . Bipolar disorder, mixed ([place])   . Thoracic back pain 06/09/2018  . Birth control counseling 06/09/2018  . Depression, recurrent ([place]) 11/20/2017  . H/O borderline personality disorder 11/20/2017  . Healthcare maintenance 11/20/2017  . Anxiety 11/20/2017  . Morbid obesity ([place]) 11/20/2017  . Borderline personality disorder ([place]) 11/20/2017     No outpatient medications have been marked as taking for the 11/05/18 encounter (Office Visit)  with Mellody Dance, DO.     No Known Allergies   ROS:  See above HPI for pertinent positives and negatives   Objective:   Height 5\' 4"  (1.626 m), weight (!) 315 lb (142.9 kg), last menstrual period 11/05/2018.  (if some vitals are omitted, this means that patient was UNABLE to obtain them even though they were asked to get them prior to [place] today.  They were asked to call us at their earliest convenience with these once obtained.)  General: A & O * 3; visually in no acute distress; in usual state of health.  Skin: Visible skin appears normal and pt's usual skin color HEENT:  EOMI, head is normocephalic and atraumatic.  Sclera are anicteric. Neck has a good range of motion.  Lips are noncyanotic Chest: normal chest excursion and movement Respiratory: speaking in full sentences, no conversational dyspnea; no use of accessory muscles Psych: insight good, mood- appears full

## 2018-11-07 ENCOUNTER — Ambulatory Visit: Payer: BC Managed Care – PPO | Admitting: Psychology

## 2018-11-20 ENCOUNTER — Telehealth: Payer: Self-pay | Admitting: Family Medicine

## 2018-11-20 NOTE — Telephone Encounter (Signed)
Patient called and set up a mental health f/u with Dr. Jenetta Downer as she has been seeing Dr. Toy Care per our recommendation and wants to keep PCP in the loop. The appt is after her move in day for school and she is having a hard time contacting Dr. Starleen Arms office to get documentation for her cat as an ESA and wants to see if that is something Dr. Jenetta Downer will do. Please advise

## 2018-11-20 NOTE — Telephone Encounter (Signed)
Dr Toy Care since she is treating pt's emotional conditions that warrant an ESA.

## 2018-11-20 NOTE — Telephone Encounter (Signed)
Please review and advise if letter can be drafted for patient. MPulliam, CMA/RT(R)

## 2018-11-24 NOTE — Telephone Encounter (Signed)
Spoke to patient and she has been able to get in touch with Dr. Starleen Arms office and they are witting the letter for her. MPulliam, CMA/RT(R)

## 2018-12-09 ENCOUNTER — Encounter: Payer: Self-pay | Admitting: Family Medicine

## 2018-12-09 ENCOUNTER — Ambulatory Visit (INDEPENDENT_AMBULATORY_CARE_PROVIDER_SITE_OTHER): Payer: BC Managed Care – PPO | Admitting: Family Medicine

## 2018-12-09 ENCOUNTER — Other Ambulatory Visit: Payer: Self-pay

## 2018-12-09 VITALS — BP 98/75 | Ht 63.0 in | Wt 298.0 lb

## 2018-12-09 DIAGNOSIS — F3160 Bipolar disorder, current episode mixed, unspecified: Secondary | ICD-10-CM

## 2018-12-09 DIAGNOSIS — F339 Major depressive disorder, recurrent, unspecified: Secondary | ICD-10-CM

## 2018-12-09 DIAGNOSIS — F4310 Post-traumatic stress disorder, unspecified: Secondary | ICD-10-CM

## 2018-12-09 DIAGNOSIS — F603 Borderline personality disorder: Secondary | ICD-10-CM

## 2018-12-09 DIAGNOSIS — F419 Anxiety disorder, unspecified: Secondary | ICD-10-CM

## 2018-12-09 NOTE — Progress Notes (Signed)
Telehealth office visit note for Anna [name], D.O- at Primary Care at [facility]   I connected with current patient today and verified that I am speaking with the correct person using two identifiers.   . Location of the patient: Home . Location of the provider: Office Only the patient (+/- their family members at pt's discretion) and myself were participating in the encounter    - This visit type was conducted due to national recommendations for restrictions regarding the COVID-19 Pandemic (e.g. social distancing) in an effort to limit this patient's exposure and mitigate transmission in our community.  This format is felt to be most appropriate for this patient at this time.   - The patient did not have access to video technology or had technical difficulties with video requiring transitioning to audio format only. - No physical exam could be performed with this format, beyond that communicated to [place] by the patient/ family members as noted.   - Additionally my office staff/ schedulers discussed with the patient that there may be a monetary charge related to this service, depending on their medical insurance.   The patient expressed understanding, and agreed to proceed.       History of Present Illness:  Goals since last visit were to walk, journal, and meditate daily.  Patient was also supposed to follow up with female psychologist and psychiatrist for mood & med management.  Mood Management - Meds through Psychiatry Feels she had a "mixture" of hitting her goals.  Since last visit, says "mentally I'm doing a lot better."  She got a hold of Dr. Toy Care at the beginning of this month/end of July.  Had a refill of her prescription already and believes she saw Dr. Toy Care a month ago.  - Therapy Has not been seeing her Administrator, sports, but does have a therapy appointment with a new counselor/therapist this coming Thursday.  "I'm gonna see if they have anybody that can do weekly."   This will be through [facility].  - New Emotional Support Animal Recently just got an ESA; an "emotional support animal."  Notes she has a kitten named Sugarbell, with paperwork signed by Dr. Toy Care.  Says her new pet is helping "very much so," "especially with everything that's happened recently."  Had another pet cat recently neutered and notes he passed away during this, and "that was traumatic for our entire family."  Notes her mom and sister offered her the new rescue kitten.  "I rescued the kitten and now she's rescuing me."  - Lifestyle, Eating, & Exercise Says she has been doing a lot better with her diet.  States she was placed on 3 medications by Dr. Toy Care, and "one of them is a diet pill, which has helped me curb my appetite."  Says "what's not good for me to eat, I've kind of gotten a sour taste in my mouth with it."  Notes she is now down to 298 lbs.  She is currently walking every other day and still doing yoga to help her back.  Other than that, she states she's been getting used to the medication.  Says at first "I had no energy and was so dizzy; it's taking me little bit by little bit to get more active."  Says she still feels a little dizzy now and then and a little weaker, but can feel herself getting stronger and more used to the medication where "we can do things but just need  to take it slow for now."   Impression and Recommendations:    1. Bipolar disorder, mixed ([place])   2. Borderline personality disorder ([place])   3. Depression, recurrent ([place])   4. Morbid obesity ([place])   5. PTSD (post-traumatic stress disorder)   6. Anxiety      - On 10/06/2018 referred to psychology and psychiatry.  Patient's goals last visit: - Agreed to walk, journal, and meditate daily. - Was supposed to follow up with female psychologist and psychiatrist for mood & med management.  Mood Management - Bipolar Disorder, BPD, Depression, PTSD, Anxiety - Per patient, followed up with Dr.  Toy Care last month. - Was placed on medications through psychiatry. - Stable at this time on current plan.  See med list through psych.  - Reviewed the "spokes of the wheel" of mood and health management.  Stressed the importance of ongoing prudent habits, including regular exercise, appropriate sleep hygiene, healthful dietary habits, and prayer/meditation to relax.  - Continue management through Psychiatry and Psychology. - Advised patient to continue to work toward meditation and exercise.  - Will continue to monitor.  BMI Counseling - Morbid Obesity, Body mass index is 52.79 kg/m Explained to patient what BMI refers to, and what it means medically.    Told patient to think about it as a "medical risk stratification measurement" and how increasing BMI is associated with increasing risk/ or worsening state of various diseases such as hypertension, hyperlipidemia, diabetes, premature OA, depression etc.  American Heart Association guidelines for healthy diet, basically Mediterranean diet, and exercise guidelines of 30 minutes 5 days per week or more discussed in detail.  Health counseling performed.  All questions answered.  Lifestyle & Preventative Health Maintenance - Advised patient to continue working toward exercising to improve overall mental, physical, and emotional health.    - Encouraged patient to engage in daily physical activity, especially a formal exercise routine.  Recommended that the patient eventually strive for at least 150 minutes of moderate cardiovascular activity per week according to guidelines established by the [facility].   - Healthy dietary habits encouraged, including low-carb, and high amounts of lean protein in diet.   - Patient should also consume adequate amounts of water.  Recommendations - Need for physical exam in near future with fasting lab work.  - As part of my medical decision making, I reviewed the following data within the [place]  History obtained from pt /family, CMA notes reviewed and incorporated if applicable, Labs reviewed, Radiograph/ tests reviewed if applicable and OV notes from prior OV's with me, as well as other specialists she/he has seen since seeing me last, were all reviewed and used in my medical decision making process today.   - Additionally, discussion had with patient regarding txmnt plan, and their biases/concerns about that plan were used in my medical decision making today.   - The patient agreed with the plan and demonstrated an understanding of the instructions.   No barriers to understanding were identified.   - Red flag symptoms and signs discussed in detail.  Patient expressed understanding regarding what to do in case of emergency\ urgent symptoms.  The patient was advised to call back or seek an in-person evaluation if the symptoms worsen or if the condition fails to improve as anticipated.   Return for CPE/ yrly physical, come fasting near future.    I provided 16* minutes of non-face-to-face time during this encounter,with over 50% of the time in direct counseling on  patients medical conditions/ medical concerns.  Additional time was spent with charting and coordination of care after the actual visit commenced.   Note:  This note was prepared with assistance of Dragon voice recognition software. Occasional wrong-word or sound-a-like substitutions may have occurred due to the inherent limitations of voice recognition software.  Anna Dance, DO  This document serves as a record of services personally performed by Anna Dance, DO. It was created on her behalf by Toni Amend, a trained medical scribe. The creation of this record is based on the scribe's personal observations and the provider's statements to them.   I have reviewed the above medical documentation for accuracy and completeness and I concur.  Anna Dance, DO 12/10/2018 7:18 PM      Patient Care Team     Relationship Specialty Notifications Start End  Anna Dance, DO PCP - General Family Medicine  10/06/18   Chucky May, MD Consulting Physician Psychiatry  12/09/18      -Vitals obtained; medications/ allergies reconciled;  personal medical, social, Sx etc.histories were updated by CMA, reviewed by me and are reflected in chart   Patient Active Problem List   Diagnosis Date Noted  . PTSD (post-traumatic stress disorder)   . Bipolar disorder, mixed ([place])   . Thoracic back pain 06/09/2018  . Birth control counseling 06/09/2018  . Depression, recurrent ([place]) 11/20/2017  . H/O borderline personality disorder 11/20/2017  . Healthcare maintenance 11/20/2017  . Anxiety 11/20/2017  . Morbid obesity ([place]) 11/20/2017  . Borderline personality disorder ([place]) 11/20/2017     Current Meds  Medication Sig  . ARIPiprazole (ABILIFY) 2 MG tablet Take 2 mg by mouth daily.  . Cholecalciferol (VITAMIN D-3) 125 MCG (5000 UT) TABS Take 1 tablet by mouth daily. (Patient not taking: Reported on 12/15/2018)  . phentermine 37.5 MG capsule Take 37.5 mg by mouth every morning.  . venlafaxine XR (EFFEXOR-XR) 150 MG 24 hr capsule Take 150 mg by mouth daily with breakfast.     Allergies:  No Known Allergies   ROS:  See above HPI for pertinent positives and negatives   Objective:   Blood pressure 98/75, height 5\' 3"  (1.6 m), weight 298 lb (135.2 kg), last menstrual period 12/06/2018.  (if some vitals are omitted, this means that patient was UNABLE to obtain them even though they were asked to get them prior to OV today.  They were asked to call us at their earliest convenience with these once obtained. )  General: A & O * 3; sounds in no acute distress; in usual state of health.  Skin: Pt confirms warm and dry extremities and pink fingertips HEENT: Pt confirms lips non-cyanotic Chest: Patient confirms normal chest excursion and movement Respiratory: speaking in full sentences, no conversational  dyspnea; patient confirms no use of accessory muscles Psych: insight appears good, mood- appears full

## 2018-12-11 ENCOUNTER — Ambulatory Visit (INDEPENDENT_AMBULATORY_CARE_PROVIDER_SITE_OTHER): Payer: BC Managed Care – PPO | Admitting: Psychology

## 2018-12-11 DIAGNOSIS — F339 Major depressive disorder, recurrent, unspecified: Secondary | ICD-10-CM

## 2018-12-11 DIAGNOSIS — F603 Borderline personality disorder: Secondary | ICD-10-CM | POA: Diagnosis not present

## 2018-12-11 DIAGNOSIS — F419 Anxiety disorder, unspecified: Secondary | ICD-10-CM

## 2018-12-15 ENCOUNTER — Encounter: Payer: Self-pay | Admitting: Family Medicine

## 2018-12-15 ENCOUNTER — Ambulatory Visit (INDEPENDENT_AMBULATORY_CARE_PROVIDER_SITE_OTHER): Payer: BC Managed Care – PPO | Admitting: Family Medicine

## 2018-12-15 ENCOUNTER — Other Ambulatory Visit: Payer: Self-pay

## 2018-12-15 DIAGNOSIS — L738 Other specified follicular disorders: Secondary | ICD-10-CM

## 2018-12-15 MED ORDER — MUPIROCIN 2 % EX OINT: TOPICAL_OINTMENT | CUTANEOUS | 0 refills | Status: DC

## 2018-12-15 NOTE — Progress Notes (Signed)
Virtual / live video office visit note for [employer], D.O- Primary Care Physician at [facility]   I connected with current patient today and beyond visually recognizing the correct individual, I verified that I am speaking with the correct person using two identifiers.  . Location of the patient: Home . Location of the provider: Office Only the patient (+/- their family members at pt's discretion) and myself were participating in the encounter    - This visit type was conducted due to national recommendations for restrictions regarding the COVID-19 Pandemic (e.g. social distancing) in an effort to limit this patient's exposure and mitigate transmission in our community.  This format is felt to be most appropriate for this patient at this time.   - The patient did have access to video technology today - No physical exam could be performed with this format, beyond that communicated to [place] by the patient/ family members as noted.   - Additionally my office staff/ schedulers discussed with the patient that there may be a monetary charge related to this service, depending on patient's medical insurance.   The patient expressed understanding, and agreed to proceed.      History of Present Illness:  States she's feeling okay but going through some skin concerns.  Notes she is not currently in her dorm and is out and about.  Skin Concerns on Breasts Patient sent in photos of areas of concern.  These were sent to my chart.  They were approximately 5 of them.  These were seen by me and reviewed with the patient  States she started several medications a month ago (prescribed by psychiatry).  At this time, she was jittery and could not sit still; states she began scratching herself.  States she would have "panic phases" where she had bumps and dried skin on her body she "tried to get rid of."  Notes due to sweating and moving "it's just gotten worse" since.    They started out looking  like blackheads on her breasts "when I first saw them."  Then she tried to get rid of them by pinching and using needle and alcohol.  Says "I have very bad habits" because she does the same thing on her face.  Says "no matter what I try, soaking them in hot water, zit cream, antibiotic cream, nothing is working."  Says that they are now getting painful.  First noticed the "blackheads" and started picking at them three weeks ago; "they didn't look bad until a week and a half ago."  Says "they just got progressively worse."  Says she thought using cream and soaking them would help, but since they were still bad, she called in.  She has stopped picking the area for "about a week now."  Now that she's stopped picking them, she says "after a good shower, they look like they get better."  But "due to the weather and due to over sweating, by the end of the day, they're back up and 'full read.' "  She denies fever or chills.    Impression and Recommendations:    1. Bacterial folliculitis     -  please see today's MyChart message for pics of patient's skin which were reviewed during appointment today  Psychiatric Care - Strongly advised patient to follow up with psychiatry as scheduled. - Psychiatry will continue to manage patient's behavioral treatment plan.  See med list. - Will continue to monitor and encourage patient to  see specialist.  Bacterial Folliculitis - Discussed that patient has caused herself folliculitis by picking at areas of sx. - Education provided today regarding possible causes of sx and healing process. - Advised patient to stop scratching/picking the areas of concern; leave them alone to heal.  - Discussed need to treat concerns as an infection. - Mupirocin ointment discussed and provided today. - Patient knows to use mupirocin ointment 2-3 times daily as prescribed.  - Discussed using a big non-stick pad over her breasts such as "second skin" in areas of concern. -  Advised patient that this will help prevent the irritation of her bra or t-shirt rubbing against the area. - Otherwise, keep the area clean and prudently covered, or uncovered to heal if possible. - Discussed that bedhseets, shirt, bra rubbing against the area will irritate the area.  - Do not use hydrogen peroxide, rubbing alcohol on area; just use soap and water to clean. - Then apply NOTHING ELSE but Rx ointment, then non-stick skin pad, then bra.  - For itch, patient knows to use benadryl only PRN.  - If the area starts to get increasingly red and symptomatic, otherwise worsening, or develop fever or chills, patient will call to be assessed for need for oral antibiotics.  - Will continue to monitor.  - As part of my medical decision making, I reviewed the following data within the [place] History obtained from pt /family, CMA notes reviewed and incorporated if applicable, Labs reviewed, Radiograph/ tests reviewed if applicable and OV notes from prior OV's with me, as well as other specialists she/he has seen since seeing me last, were all reviewed and used in my medical decision making process today.   - Additionally, discussion had with patient regarding txmnt plan, their biases about that plan etc were used in my medical decision making today.   - The patient agreed with the plan and demonstrated an understanding of the instructions.   No barriers to understanding were identified.   - Red flag symptoms and signs discussed in detail.  Patient expressed understanding regarding what to do in case of emergency\ urgent symptoms.  The patient was advised to call back or seek an in-person evaluation if the symptoms worsen or if the condition fails to improve as anticipated.   Return if symptoms worsen or fail to improve, for Also for chronic care as previously discussed.   Meds ordered this encounter  Medications  . mupirocin ointment (BACTROBAN) 2 %    Sig: Apply to  affected area 2 or 3 times per day for 10 days    Dispense:  60 g    Refill:  0    I provided 19 minutes of non-face-to-face time during this encounter,with over 50% of the time in direct counseling on patients medical conditions/ medical concerns.  Additional time was spent with charting and coordination of care after the actual visit commenced.   Note:  This note was prepared with assistance of Dragon voice recognition software. Occasional wrong-word or sound-a-like substitutions may have occurred due to the inherent limitations of voice recognition software.  This document serves as a record of services personally performed by Mellody Dance, DO. It was created on her behalf by Toni Amend, a trained medical scribe. The creation of this record is based on the scribe's personal observations and the provider's statements to them.   I have reviewed the above medical documentation for accuracy and completeness and I concur.  Mellody Dance, DO 12/15/2018 2:09  PM        Patient Care Team    Relationship Specialty Notifications Start End  Mellody Dance, DO PCP - General Family Medicine  10/06/18   Chucky May, MD Consulting Physician Psychiatry  12/09/18     -Vitals obtained; medications/ allergies reconciled;  personal medical, social, Sx etc.histories were updated by CMA, reviewed by me and are reflected in chart  Patient Active Problem List   Diagnosis Date Noted  . PTSD (post-traumatic stress disorder)   . Bipolar disorder, mixed ([place])   . Thoracic back pain 06/09/2018  . Birth control counseling 06/09/2018  . Depression, recurrent ([place]) 11/20/2017  . H/O borderline personality disorder 11/20/2017  . Healthcare maintenance 11/20/2017  . Anxiety 11/20/2017  . Morbid obesity ([place]) 11/20/2017  . Borderline personality disorder ([place]) 11/20/2017     No outpatient medications have been marked as taking for the 12/15/18 encounter (Office Visit) with Mellody Dance,  DO.     No Known Allergies   ROS:  See above HPI for pertinent positives and negatives   Objective:   Last menstrual period 12/06/2018.  (if some vitals are omitted, this means that patient was UNABLE to obtain them even though they were asked to get them prior to [place] today.  They were asked to call us at their earliest convenience with these once obtained.)  General: A & O * 3; visually in no acute distress; in usual state of health.  Skin: Via video visible skin appears normal and pt's usual skin color, except for in patients pictures sent through my chart: with several skin sores scattered around bilateral breasts.  Some look de-surfaced or possibly even ulcerated.  Area of surrounding mild erythema besides each small the surfaced pustule.  No gross cellulitis, pus etc. appreciated HEENT:  EOMI, head is normocephalic and atraumatic.  Sclera are anicteric. Neck has a good range of motion.  Lips are noncyanotic Chest: normal chest excursion and movement Respiratory: speaking in full sentences, no conversational dyspnea; no use of accessory muscles Psych: insight good, mood- appears full

## 2018-12-18 ENCOUNTER — Ambulatory Visit: Payer: BC Managed Care – PPO | Admitting: Family Medicine

## 2019-01-08 ENCOUNTER — Other Ambulatory Visit: Payer: BC Managed Care – PPO

## 2019-01-15 ENCOUNTER — Ambulatory Visit (INDEPENDENT_AMBULATORY_CARE_PROVIDER_SITE_OTHER): Payer: BC Managed Care – PPO | Admitting: Family Medicine

## 2019-01-15 ENCOUNTER — Other Ambulatory Visit: Payer: Self-pay

## 2019-01-15 ENCOUNTER — Encounter: Payer: Self-pay | Admitting: Family Medicine

## 2019-01-15 ENCOUNTER — Encounter: Payer: BC Managed Care – PPO | Admitting: Adult Health

## 2019-01-15 VITALS — BP 130/71 | HR 83 | Temp 98.2°F | Ht 63.75 in | Wt 312.5 lb

## 2019-01-15 DIAGNOSIS — Z0000 Encounter for general adult medical examination without abnormal findings: Secondary | ICD-10-CM

## 2019-01-15 DIAGNOSIS — Z719 Counseling, unspecified: Secondary | ICD-10-CM

## 2019-01-15 DIAGNOSIS — Z23 Encounter for immunization: Secondary | ICD-10-CM

## 2019-01-15 DIAGNOSIS — Z114 Encounter for screening for human immunodeficiency virus [HIV]: Secondary | ICD-10-CM | POA: Diagnosis not present

## 2019-01-15 DIAGNOSIS — Z113 Encounter for screening for infections with a predominantly sexual mode of transmission: Secondary | ICD-10-CM

## 2019-01-15 NOTE — Progress Notes (Signed)
Impression and Recommendations:    1. Encounter for wellness examination   2. Health education/counseling   3. Screening for HIV (human immunodeficiency virus)   4. Need for HPV vaccination   5. Need for influenza vaccination   6. Screening for STD (sexually transmitted disease)      1) Anticipatory Guidance: Discussed importance of wearing a seatbelt while driving, not texting while driving; sunscreen when outside along with yearly skin surveillance; eating a well balanced and modest diet; physical activity at least 25 minutes per day or 150 min/ week of moderate to intense activity.  - Sexual Health counseling provided.  Safe sex and prudent sex practices discussed with patient today.  Advised patient to obtain full STD screen before and after each new partner, ideally together with partner.  Lengthy discussion held and all questions were answered.  - Prudent skin screening habits discussed with patient during exam today.  Extensively discussed red flag lesions to watch out for on the skin.  Education provided and all questions answered.  2) Immunizations / Screenings / Labs:  All immunizations and screenings that patient agrees to, are up-to-date per recommendations or will be updated today.  Patient understands the needs for q 59modental and yearly vision screens which pt will schedule independently. Obtain CBC, CMP, HgA1c, Lipid panel, TSH and vit D when fasting if not already done recently.   - Need for HPV vaccination.  Discussion and education provided to patient today.  Patient agrees to obtain.  - Need for STD screening per guidelines discussed.  Patient agrees to obtain.  - Need for influenza vaccination.  Patient agrees to obtain.  Education and counseling provided to patient today regarding the nature of the vaccination.  All questions were answered.  - Follows up with Dr. JTalbert Nanof [facility].  - Need for lab work.  Obtained today.  3) Weight:   Discussed goal of  losing even 5-10% of current body weight which would improve overall feelings of well being and improve objective health data significantly.   Improve nutrient density of diet through increasing intake of fruits and vegetables and decreasing saturated/trans fats, white flour products and refined sugar products.   Explained to patient what BMI refers to, and what it means medically.  Told patient to think about it as a "medical risk stratification measurement" and how increasing BMI is associated with increasing risk/ or worsening state of various diseases such as hypertension, hyperlipidemia, diabetes, premature OA, depression etc.  American Heart Association guidelines for healthy diet, basically Mediterranean diet, and exercise guidelines of 30 minutes 5 days per week or more discussed in detail.  Health counseling performed.  All questions answered.  4) Health Education/Counseling & Preventative Health Maintenance - Advised patient to continue working toward exercising to improve overall mental, physical, and emotional health.    - Reviewed the "spokes of the wheel" of mood and health management.  Stressed the importance of ongoing prudent habits, including regular exercise, appropriate sleep hygiene, healthful dietary habits, and prayer/meditation to relax.  - Encouraged patient to engage in daily physical activity, especially a formal exercise routine.  Recommended that the patient eventually strive for at least 150 minutes of moderate cardiovascular activity per week according to guidelines established by the [facility]   - Healthy dietary habits encouraged, including low-carb, and high amounts of lean protein in diet.   - Patient should also consume adequate amounts of water.  5) Recommendations - Per patient has another appointment set up  for next week.    No orders of the defined types were placed in this encounter.   Orders Placed This Encounter  Procedures  . GC/Chlamydia Probe Amp   . Flu Vaccine QUAD 6+ mos PF IM (Fluarix Quad PF)  . HPV 9-valent vaccine,Recombinat  . HIV Antibody (routine testing w rflx)  . CBC with Differential/Platelet  . Comprehensive metabolic panel  . Hemoglobin A1c  . Lipid panel  . T3  . T4, free  . TSH  . VITAMIN D 25 Hydroxy (Vit-D Deficiency, Fractures)    Gross side effects, risk and benefits, and alternatives of medications discussed with patient.  Patient is aware that all medications have potential side effects and we are unable to predict every side effect or drug-drug interaction that may occur.  Expresses verbal understanding and consents to current therapy plan and treatment regimen.  F-up preventative CPE in 1 year. F/up sooner for chronic care management as discussed and/or prn.  Please see orders placed and AVS handed out to patient at the end of our visit for further patient instructions/ counseling done pertaining to today's office visit.   This document serves as a record of services personally performed by Anna Dance, DO. It was created on her behalf by Toni Amend, a trained medical scribe. The creation of this record is based on the scribe's personal observations and the provider's statements to them.   I have reviewed the above medical documentation for accuracy and completeness and I concur.  Anna Dance, DO 01/15/2019 1:21 PM       Subjective:    Chief Complaint  Patient presents with  . Annual Exam   CC:   HPI: Anna [name] is a 23 y.o. female who presents to [facility] today a yearly health maintenance exam.  Health Maintenance Summary Reviewed and updated, unless pt declines services.  Tobacco History Reviewed:  Y; former smoker, quit in 2019; 0.25 ppd, 0.75 pack years.  Patient states she is no longer vaping.  Alcohol:  No concerns, no excessive use. Exercise Habits:  Not meeting AHA guidelines; confirms that she is trying to be active at least 30  minutes per day, going to campus to walk around. STD concerns:  Not currently sexually active. Drug Use:   None Birth control method:  Nexplanon, managed by OBGYN. Menses regular:  No concerns, additionally followed by OBGYN. Lumps or breast concerns:  No concerns, additionally followed by OBGYN. Breast Cancer Family History:  None reported.  Continues going to [employer].  States she's taking several hours of classes and feeling drained since they're not giving them their fall break.  Says she was out of whack with her grades for a bit due to some medication issues, but she's not concerned about the semester at large.  [place].  She is not on the meal plan at college.  - Dental Health Has a dental visit next week.  Notes she goes every six months.  - [place] to follow up with psychiatry.  Feels her sx are currently well controlled.  - Dermatological Health Patient denies concerns about her skin today.  Confirms sleeping well at night, getting around eight hours.  Believes that her weight is up.  Denies concerns with breathing.  States she continues using her inhaler. Denies concerns with bowels or urinary symptoms. Denies intolerances to food.  Denies diarrhea or constipation. Denies stress eating due to COVID.    Immunization History  Administered Date(s) Administered  . DTaP 02/17/1996, 04/27/1996, 06/22/1996, 06/24/1997  . HPV 9-valent 01/15/2019  . Hepatitis A 10/13/2007  . Hepatitis B 12/30/1995, 09/17/1996, 12/23/1996  . HiB (PRP-OMP) 02/17/1996, 04/27/1996, 06/22/1996, 03/24/1997  . IPV 02/17/1996, 04/27/1996, 06/24/1997  . Influenza,inj,Quad PF,6+ Mos 01/15/2019  . MMR 03/24/1997, 10/02/2000  . Td 10/13/2007  . Tdap 08/21/2017  . Varicella 12/23/1996    Health Maintenance  Topic Date Due  . HIV Screening  12/21/2010  . INFLUENZA VACCINE  11/22/2018  . PAP-Cervical Cytology Screening  01/14/2021  . PAP  SMEAR-Modifier  01/14/2021  . TETANUS/TDAP  08/22/2027     Wt Readings from Last 3 Encounters:  01/15/19 (!) 312 lb 8 oz (141.7 kg)  12/09/18 298 lb (135.2 kg)  11/05/18 (!) 315 lb (142.9 kg)   BP Readings from Last 3 Encounters:  01/15/19 130/71  12/09/18 98/75  10/06/18 111/77   Pulse Readings from Last 3 Encounters:  01/15/19 83  10/06/18 99  07/16/18 70     Past Medical History:  Diagnosis Date  . Anxiety   . Borderline personality disorder ([place])   . Depression   . Enlarged heart   . Pneumonia   . PTSD (post-traumatic stress disorder)       History reviewed. No pertinent surgical history.    Family History  Problem Relation Age of Onset  . Clotting disorder Mother   . Cancer Father   . COPD Father        skin  . Depression Father   . Healthy Sister   . Healthy Brother   . Cancer Maternal Grandmother        breast  . Colon cancer Paternal Grandmother       Social History   Substance and Sexual Activity  Drug Use Not Currently   Comment: 2017 Marijuana  ,   Social History   Substance and Sexual Activity  Alcohol Use Yes   Comment: occassionally  ,   Social History   Tobacco Use  Smoking Status Former Smoker  . Packs/day: 0.25  . Years: 3.00  . Pack years: 0.75  . Types: Cigarettes  . Quit date: 10/21/2017  . Years since quitting: 1.2  Smokeless Tobacco Never Used  ,   Social History   Substance and Sexual Activity  Sexual Activity Yes  . Partners: Male  . Birth control/protection: Pill, Condom    Current Outpatient Medications on File Prior to Visit  Medication Sig Dispense Refill  . amphetamine-dextroamphetamine (ADDERALL XR) 20 MG 24 hr capsule Take 1 capsule by mouth daily.    . ARIPiprazole (ABILIFY) 2 MG tablet Take 2 mg by mouth daily.    . Cholecalciferol (VITAMIN D-3) 125 MCG (5000 UT) TABS Take 1 tablet by mouth daily. 90 tablet 3  . phentermine 37.5 MG capsule Take 37.5 mg by mouth every morning.    . venlafaxine  XR (EFFEXOR-XR) 150 MG 24 hr capsule Take 150 mg by mouth daily with breakfast.     No current facility-administered medications on file prior to visit.     Allergies: Patient has no known allergies.  Review of Systems: General:   Denies fever, chills, unexplained weight loss.  Optho/Auditory:   Denies visual changes, blurred vision/LOV Respiratory:   Denies SOB, DOE more than baseline levels.  Cardiovascular:   Denies chest pain, palpitations, new onset peripheral edema  Gastrointestinal:   Denies nausea, vomiting, diarrhea.  Genitourinary: Denies dysuria, freq/ urgency, flank pain or discharge from genitals.  Endocrine:  Denies hot or cold intolerance, polyuria, polydipsia. Musculoskeletal:   Denies unexplained myalgias, joint swelling, unexplained arthralgias, gait problems.  Skin:  Denies rash, suspicious lesions Neurological:     Denies dizziness, unexplained weakness, numbness  Psychiatric/Behavioral:   Denies mood changes, suicidal or homicidal ideations, hallucinations    Objective:    Blood pressure 130/71, pulse 83, temperature 98.2 F (36.8 C), temperature source Oral, height 5' 3.75" (1.619 m), weight (!) 312 lb 8 oz (141.7 kg), last menstrual period 01/05/2019, SpO2 98 %. Body mass index is 54.06 kg/m. General Appearance:    Alert, cooperative, no distress, appears stated age  Head:    Normocephalic, without obvious abnormality, atraumatic  Eyes:    PERRL, conjunctiva/corneas clear, EOM's intact, fundi    benign, both eyes  Ears:    Normal TM's and external ear canals, both ears  Nose:   Nares normal, septum midline, mucosa normal, no drainage    or sinus tenderness  Throat:   Lips w/o lesion, mucosa moist, and tongue normal; teeth and   gums normal  Neck:   Supple, symmetrical, trachea midline, no adenopathy;    thyroid:  no enlargement/tenderness/nodules; no carotid   bruit or JVD  Back:     Symmetric, no curvature, ROM normal, no CVA tenderness  Lungs:      Clear to auscultation bilaterally, respirations unlabored, no       Wh/ R/ R  Chest Wall:    No tenderness or gross deformity; normal excursion   Heart:    Regular rate and rhythm, S1 and S2 normal, no murmur, rub   or gallop  Breast Exam:    Deferred to OBGYN.  Abdomen:     Soft, non-tender, bowel sounds active all four quadrants, NO   G/R/R, no masses, no organomegaly  Genitalia:    Deferred to OBGYN.  Rectal:    Deferred to OBGYN.  Extremities:   Extremities normal, atraumatic, no cyanosis or gross edema  Pulses:   2+ and symmetric all extremities  Skin:   Warm, dry, Skin color, texture, turgor normal, no obvious rashes or lesions Psych: No HI/SI, judgement and insight good, Euthymic mood. Full Affect.  Neurologic:   CNII-XII intact, normal strength, sensation and reflexes    Throughout

## 2019-01-15 NOTE — Patient Instructions (Signed)
Please realize, EXERCISE IS MEDICINE!  -  American Heart Association ( AHA) guidelines for exercise : If you are in good health, without any medical conditions, you should engage in 150-300 minutes of moderate intensity aerobic activity per week.  This means you should be huffing and puffing throughout your workout.   Engaging in regular exercise will improve brain function and memory, as well as improve mood, boost immune system and help with weight management.  As well as the other, more well-known effects of exercise such as decreasing blood sugar levels, decreasing blood pressure,  and decreasing bad cholesterol levels/ increasing good cholesterol levels.     -  The AHA strongly endorses consumption of a diet that contains a variety of foods from all the food categories with an emphasis on fruits and vegetables; fat-free and low-fat dairy products; cereal and grain products; legumes and nuts; and fish, poultry, and/or extra lean meats.    Excessive food intake, especially of foods high in saturated and trans fats, sugar, and salt, should be avoided.    Adequate water intake of roughly 1/2 of your weight in pounds, should equal the ounces of water per day you should drink.  So for instance, if you're 200 pounds, that would be 100 ounces of water per day.         Mediterranean Diet  Why follow it? Research shows. . Those who follow the Mediterranean diet have a reduced risk of heart disease  . The diet is associated with a reduced incidence of Parkinson's and Alzheimer's diseases . People following the diet may have longer life expectancies and lower rates of chronic diseases  . The Dietary Guidelines for Americans recommends the Mediterranean diet as an eating plan to promote health and prevent disease  What Is the Mediterranean Diet?  . Healthy eating plan based on typical foods and recipes of Mediterranean-style cooking . The diet is primarily a plant based diet; these foods should make up a  majority of meals   Starches - Plant based foods should make up a majority of meals - They are an important sources of vitamins, minerals, energy, antioxidants, and fiber - Choose whole grains, foods high in fiber and minimally processed items  - Typical grain sources include wheat, oats, barley, corn, brown rice, bulgar, farro, millet, polenta, couscous  - Various types of beans include chickpeas, lentils, fava beans, black beans, white beans   Fruits  Veggies - Large quantities of antioxidant rich fruits & veggies; 6 or more servings  - Vegetables can be eaten raw or lightly drizzled with oil and cooked  - Vegetables common to the traditional Mediterranean Diet include: artichokes, arugula, beets, broccoli, brussel sprouts, cabbage, carrots, celery, collard greens, cucumbers, eggplant, kale, leeks, lemons, lettuce, mushrooms, okra, onions, peas, peppers, potatoes, pumpkin, radishes, rutabaga, shallots, spinach, sweet potatoes, turnips, zucchini - Fruits common to the Mediterranean Diet include: apples, apricots, avocados, cherries, clementines, dates, figs, grapefruits, grapes, melons, nectarines, oranges, peaches, pears, pomegranates, strawberries, tangerines  Fats - Replace butter and margarine with healthy oils, such as olive oil, canola oil, and tahini  - Limit nuts to no more than a handful a day  - Nuts include walnuts, almonds, pecans, pistachios, pine nuts  - Limit or avoid candied, honey roasted or heavily salted nuts - Olives are central to the Mediterranean diet - can be eaten whole or used in a variety of dishes   Meats Protein - Limiting red meat: no more than a few times a month -   When eating red meat: choose lean cuts and keep the portion to the size of deck of cards - Eggs: approx. 0 to 4 times a week  - Fish and lean poultry: at least 2 a week  - Healthy protein sources include, chicken, [place], lean beef, lamb - Increase intake of seafood such as tuna, salmon, trout,  mackerel, shrimp, scallops - Avoid or limit high fat processed meats such as sausage and bacon  Dairy - Include moderate amounts of low fat dairy products  - Focus on healthy dairy such as fat free yogurt, skim milk, low or reduced fat cheese - Limit dairy products higher in fat such as whole or 2% milk, cheese, ice cream  Alcohol - Moderate amounts of red wine is ok  - No more than 5 oz daily for women (all ages) and men older than age 72  - No more than 10 oz of wine daily for men younger than 12  Other - Limit sweets and other desserts  - Use herbs and spices instead of salt to flavor foods  - Herbs and spices common to the traditional Mediterranean Diet include: basil, bay leaves, chives, cloves, cumin, fennel, garlic, lavender, marjoram, mint, oregano, parsley, pepper, rosemary, sage, savory, sumac, tarragon, thyme   It's not just a diet, it's a lifestyle:  . The Mediterranean diet includes lifestyle factors typical of those in the region  . Foods, drinks and meals are best eaten with others and savored . Daily physical activity is important for overall good health . This could be strenuous exercise like running and aerobics . This could also be more leisurely activities such as walking, housework, yard-work, or taking the stairs . Moderation is the key; a balanced and healthy diet accommodates most foods and drinks . Consider portion sizes and frequency of consumption of certain foods   Meal Ideas & Options:  . Breakfast:  o Whole wheat toast or whole wheat English muffins with peanut butter & hard boiled egg o Steel cut oats topped with apples & cinnamon and skim milk  o Fresh fruit: banana, strawberries, melon, berries, peaches  o Smoothies: strawberries, bananas, greek yogurt, peanut butter o Low fat greek yogurt with blueberries and granola  o Egg white omelet with spinach and mushrooms o Breakfast couscous: whole wheat couscous, apricots, skim milk, cranberries  . Sandwiches:   o Hummus and grilled vegetables (peppers, zucchini, squash) on whole wheat bread   o Grilled chicken on whole wheat pita with lettuce, tomatoes, cucumbers or tzatziki  o Tuna salad on whole wheat bread: tuna salad made with greek yogurt, olives, red peppers, capers, green onions o Garlic rosemary lamb pita: lamb sauted with garlic, rosemary, salt & pepper; add lettuce, cucumber, greek yogurt to pita - flavor with lemon juice and black pepper  . Seafood:  o Mediterranean grilled salmon, seasoned with garlic, basil, parsley, lemon juice and black pepper o Shrimp, lemon, and spinach whole-grain pasta salad made with low fat greek yogurt  o Seared scallops with lemon orzo  o Seared tuna steaks seasoned salt, pepper, coriander topped with tomato mixture of olives, tomatoes, olive oil, minced garlic, parsley, green onions and cappers  . Meats:  o Herbed greek chicken salad with kalamata olives, cucumber, feta  o Red bell peppers stuffed with spinach, bulgur, lean ground beef (or lentils) & topped with feta   o Kebabs: skewers of chicken, tomatoes, onions, zucchini, squash  o [place] burgers: made with red onions, mint, dill, lemon juice, feta  cheese topped with roasted red peppers . Vegetarian o Cucumber salad: cucumbers, artichoke hearts, celery, red onion, feta cheese, tossed in olive oil & lemon juice  o Hummus and whole grain pita points with a greek salad (lettuce, tomato, feta, olives, cucumbers, red onion) o Lentil soup with celery, carrots made with vegetable broth, garlic, salt and pepper  o Tabouli salad: parsley, bulgur, mint, scallions, cucumbers, tomato, radishes, lemon juice, olive oil, salt and pepper.     Preventive Care for Adults, Female  A healthy lifestyle and preventive care can promote health and wellness. Preventive health guidelines for women include the following key practices.   A routine yearly physical is a good way to check with your health care provider about  your health and preventive screening. It is a chance to share any concerns and updates on your health and to receive a thorough exam.   Visit your dentist for a routine exam and preventive care every 6 months. Brush your teeth twice a day and floss once a day. Good oral hygiene prevents tooth decay and gum disease.   The frequency of eye exams is based on your age, health, family medical history, use of contact lenses, and other factors. Follow your health care provider's recommendations for frequency of eye exams.   Eat a healthy diet. Foods like vegetables, fruits, whole grains, low-fat dairy products, and lean protein foods contain the nutrients you need without too many calories. Decrease your intake of foods high in solid fats, added sugars, and salt. Eat the right amount of calories for you.Get information about a proper diet from your health care provider, if necessary.   Regular physical exercise is one of the most important things you can do for your health. Most adults should get at least 150 minutes of moderate-intensity exercise (any activity that increases your heart rate and causes you to sweat) each week. In addition, most adults need muscle-strengthening exercises on 2 or more days a week.   Maintain a healthy weight. The body mass index (BMI) is a screening tool to identify possible weight problems. It provides an estimate of body fat based on height and weight. Your health care provider can find your BMI, and can help you achieve or maintain a healthy weight.For adults 20 years and older:   - A BMI below 18.5 is considered underweight.   - A BMI of 18.5 to 24.9 is normal.   - A BMI of 25 to 29.9 is considered overweight.   - A BMI of 30 and above is considered obese.   Maintain normal blood lipids and cholesterol levels by exercising and minimizing your intake of trans and saturated fats.  Eat a balanced diet with plenty of fruit and vegetables. Blood tests for lipids and  cholesterol should begin at age 20 and be repeated every 5 years minimum.  If your lipid or cholesterol levels are high, you are over 40, or you are at high risk for heart disease, you may need your cholesterol levels checked more frequently.Ongoing high lipid and cholesterol levels should be treated with medicines if diet and exercise are not working.   If you smoke, find out from your health care provider how to quit. If you do not use tobacco, do not start.   Lung cancer screening is recommended for adults aged 55-80 years who are at high risk for developing lung cancer because of a history of smoking. A yearly low-dose CT scan of the lungs is recommended for people who   have at least a 30-pack-year history of smoking and are a current smoker or have quit within the past 15 years. A pack year of smoking is smoking an average of 1 pack of cigarettes a day for 1 year (for example: 1 pack a day for 30 years or 2 packs a day for 15 years). Yearly screening should continue until the smoker has stopped smoking for at least 15 years. Yearly screening should be stopped for people who develop a health problem that would prevent them from having lung cancer treatment.   If you are pregnant, do not drink alcohol. If you are breastfeeding, be very cautious about drinking alcohol. If you are not pregnant and choose to drink alcohol, do not have more than 1 drink per day. One drink is considered to be 12 ounces (355 mL) of beer, 5 ounces (148 mL) of wine, or 1.5 ounces (44 mL) of liquor.   Avoid use of street drugs. Do not share needles with anyone. Ask for help if you need support or instructions about stopping the use of drugs.   High blood pressure causes heart disease and increases the risk of stroke. Your blood pressure should be checked at least yearly.  Ongoing high blood pressure should be treated with medicines if weight loss and exercise do not work.   If you are 55-79 years old, ask your health  care provider if you should take aspirin to prevent strokes.   Diabetes screening involves taking a blood sample to check your fasting blood sugar level. This should be done once every 3 years, after age 45, if you are within normal weight and without risk factors for diabetes. Testing should be considered at a younger age or be carried out more frequently if you are overweight and have at least 1 risk factor for diabetes.   Breast cancer screening is essential preventive care for women. You should practice "breast self-awareness."  This means understanding the normal appearance and feel of your breasts and may include breast self-examination.  Any changes detected, no matter how small, should be reported to a health care provider.  Women in their 20s and 30s should have a clinical breast exam (CBE) by a health care provider as part of a regular health exam every 1 to 3 years.  After age 40, women should have a CBE every year.  Starting at age 40, women should consider having a mammogram (breast X-ray test) every year.  Women who have a family history of breast cancer should talk to their health care provider about genetic screening.  Women at a high risk of breast cancer should talk to their health care providers about having an MRI and a mammogram every year.   -Breast cancer gene (BRCA)-related cancer risk assessment is recommended for women who have family members with BRCA-related cancers. BRCA-related cancers include breast, ovarian, tubal, and peritoneal cancers. Having family members with these cancers may be associated with an increased risk for harmful changes (mutations) in the breast cancer genes BRCA1 and BRCA2. Results of the assessment will determine the need for genetic counseling and BRCA1 and BRCA2 testing.   The Pap test is a screening test for cervical cancer. A Pap test can show cell changes on the cervix that might become cervical cancer if left untreated. A Pap test is a procedure  in which cells are obtained and examined from the lower end of the uterus (cervix).   - Women should have a Pap test starting at age   21.   - Between ages 21 and 29, Pap tests should be repeated every 2 years.   - Beginning at age 30, you should have a Pap test every 3 years as long as the past 3 Pap tests have been normal.   - Some women have medical problems that increase the chance of getting cervical cancer. Talk to your health care provider about these problems. It is especially important to talk to your health care provider if a new problem develops soon after your last Pap test. In these cases, your health care provider may recommend more frequent screening and Pap tests.   - The above recommendations are the same for women who have or have not gotten the vaccine for human papillomavirus (HPV).   - If you had a hysterectomy for a problem that was not cancer or a condition that could lead to cancer, then you no longer need Pap tests. Even if you no longer need a Pap test, a regular exam is a good idea to make sure no other problems are starting.   - If you are between ages 65 and 70 years, and you have had normal Pap tests going back 10 years, you no longer need Pap tests. Even if you no longer need a Pap test, a regular exam is a good idea to make sure no other problems are starting.   - If you have had past treatment for cervical cancer or a condition that could lead to cancer, you need Pap tests and screening for cancer for at least 20 years after your treatment.   - If Pap tests have been discontinued, risk factors (such as a new sexual partner) need to be reassessed to determine if screening should be resumed.   - The HPV test is an additional test that may be used for cervical cancer screening. The HPV test looks for the virus that can cause the cell changes on the cervix. The cells collected during the Pap test can be tested for HPV. The HPV test could be used to screen women aged 30  years and older, and should be used in women of any age who have unclear Pap test results. After the age of 30, women should have HPV testing at the same frequency as a Pap test.   Colorectal cancer can be detected and often prevented. Most routine colorectal cancer screening begins at the age of 50 years and continues through age 75 years. However, your health care provider may recommend screening at an earlier age if you have risk factors for colon cancer. On a yearly basis, your health care provider may provide home test kits to check for hidden blood in the stool.  Use of a small camera at the end of a tube, to directly examine the colon (sigmoidoscopy or colonoscopy), can detect the earliest forms of colorectal cancer. Talk to your health care provider about this at age 50, when routine screening begins. Direct exam of the colon should be repeated every 5 -10 years through age 75 years, unless early forms of pre-cancerous polyps or small growths are found.   People who are at an increased risk for hepatitis B should be screened for this virus. You are considered at high risk for hepatitis B if:  -You were born in a country where hepatitis B occurs often. Talk with your health care provider about which countries are considered high risk.  - Your parents were born in a high-risk country and you have not   received a shot to protect against hepatitis B (hepatitis B vaccine).  - You have HIV or AIDS.  - You use needles to inject street drugs.  - You live with, or have sex with, someone who has Hepatitis B.  - You get hemodialysis treatment.  - You take certain medicines for conditions like cancer, organ transplantation, and autoimmune conditions.   Hepatitis C blood testing is recommended for all people born from 1945 through 1965 and any individual with known risks for hepatitis C.   Practice safe sex. Use condoms and avoid high-risk sexual practices to reduce the spread of sexually transmitted  infections (STIs). STIs include gonorrhea, chlamydia, syphilis, trichomonas, herpes, HPV, and human immunodeficiency virus (HIV). Herpes, HIV, and HPV are viral illnesses that have no cure. They can result in disability, cancer, and death. Sexually active women aged 25 years and younger should be checked for chlamydia. Older women with new or multiple partners should also be tested for chlamydia. Testing for other STIs is recommended if you are sexually active and at increased risk.   Osteoporosis is a disease in which the bones lose minerals and strength with aging. This can result in serious bone fractures or breaks. The risk of osteoporosis can be identified using a bone density scan. Women ages 65 years and over and women at risk for fractures or osteoporosis should discuss screening with their health care providers. Ask your health care provider whether you should take a calcium supplement or vitamin D to There are also several preventive steps women can take to avoid osteoporosis and resulting fractures or to keep osteoporosis from worsening. -->Recommendations include:  Eat a balanced diet high in fruits, vegetables, calcium, and vitamins.  Get enough calcium. The recommended total intake of is 1,200 mg daily; for best absorption, if taking supplements, divide doses into 250-500 mg doses throughout the day. Of the two types of calcium, calcium carbonate is best absorbed when taken with food but calcium citrate can be taken on an empty stomach.  Get enough vitamin D. NAMS and the National Osteoporosis Foundation recommend at least 1,000 IU per day for women age 50 and over who are at risk of vitamin D deficiency. Vitamin D deficiency can be caused by inadequate sun exposure (for example, those who live in northern latitudes).  Avoid alcohol and smoking. Heavy alcohol intake (more than 7 drinks per week) increases the risk of falls and hip fracture and women smokers tend to lose bone more rapidly  and have lower bone mass than nonsmokers. Stopping smoking is one of the most important changes women can make to improve their health and decrease risk for disease.  Be physically active every day. Weight-bearing exercise (for example, fast walking, hiking, jogging, and weight training) may strengthen bones or slow the rate of bone loss that comes with aging. Balancing and muscle-strengthening exercises can reduce the risk of falling and fracture.  Consider therapeutic medications. Currently, several types of effective drugs are available. Healthcare providers can recommend the type most appropriate for each woman.  Eliminate environmental factors that may contribute to accidents. Falls cause nearly 90% of all osteoporotic fractures, so reducing this risk is an important bone-health strategy. Measures include ample lighting, removing obstructions to walking, using nonskid rugs on floors, and placing mats and/or grab bars in showers.  Be aware of medication side effects. Some common medicines make bones weaker. These include a type of steroid drug called glucocorticoids used for arthritis and asthma, some antiseizure drugs, certain sleeping   pills, treatments for endometriosis, and some cancer drugs. An overactive thyroid gland or using too much thyroid hormone for an underactive thyroid can also be a problem. If you are taking these medicines, talk to your doctor about what you can do to help protect your bones.reduce the rate of osteoporosis.    Menopause can be associated with physical symptoms and risks. Hormone replacement therapy is available to decrease symptoms and risks. You should talk to your health care provider about whether hormone replacement therapy is right for you.   Use sunscreen. Apply sunscreen liberally and repeatedly throughout the day. You should seek shade when your shadow is shorter than you. Protect yourself by wearing long sleeves, pants, a wide-brimmed hat, and sunglasses  year round, whenever you are outdoors.   Once a month, do a whole body skin exam, using a mirror to look at the skin on your back. Tell your health care provider of new moles, moles that have irregular borders, moles that are larger than a pencil eraser, or moles that have changed in shape or color.   -Stay current with required vaccines (immunizations).   Influenza vaccine. All adults should be immunized every year.  Tetanus, diphtheria, and acellular pertussis (Td, Tdap) vaccine. Pregnant women should receive 1 dose of Tdap vaccine during each pregnancy. The dose should be obtained regardless of the length of time since the last dose. Immunization is preferred during the 27th 36th week of gestation. An adult who has not previously received Tdap or who does not know her vaccine status should receive 1 dose of Tdap. This initial dose should be followed by tetanus and diphtheria toxoids (Td) booster doses every 10 years. Adults with an unknown or incomplete history of completing a 3-dose immunization series with Td-containing vaccines should begin or complete a primary immunization series including a Tdap dose. Adults should receive a Td booster every 10 years.  Varicella vaccine. An adult without evidence of immunity to varicella should receive 2 doses or a second dose if she has previously received 1 dose. Pregnant females who do not have evidence of immunity should receive the first dose after pregnancy. This first dose should be obtained before leaving the health care facility. The second dose should be obtained 4 8 weeks after the first dose.  Human papillomavirus (HPV) vaccine. Females aged 13 26 years who have not received the vaccine previously should obtain the 3-dose series. The vaccine is not recommended for use in pregnant females. However, pregnancy testing is not needed before receiving a dose. If a female is found to be pregnant after receiving a dose, no treatment is needed. In that  case, the remaining doses should be delayed until after the pregnancy. Immunization is recommended for any person with an immunocompromised condition through the age of 26 years if she did not get any or all doses earlier. During the 3-dose series, the second dose should be obtained 4 8 weeks after the first dose. The third dose should be obtained 24 weeks after the first dose and 16 weeks after the second dose.  Zoster vaccine. One dose is recommended for adults aged 60 years or older unless certain conditions are present.  Measles, mumps, and rubella (MMR) vaccine. Adults born before 1957 generally are considered immune to measles and mumps. Adults born in 1957 or later should have 1 or more doses of MMR vaccine unless there is a contraindication to the vaccine or there is laboratory evidence of immunity to each of the three diseases.   A routine second dose of MMR vaccine should be obtained at least 28 days after the first dose for students attending postsecondary schools, health care workers, or international travelers. People who received inactivated measles vaccine or an unknown type of measles vaccine during 1963 1967 should receive 2 doses of MMR vaccine. People who received inactivated mumps vaccine or an unknown type of mumps vaccine before 1979 and are at high risk for mumps infection should consider immunization with 2 doses of MMR vaccine. For females of childbearing age, rubella immunity should be determined. If there is no evidence of immunity, females who are not pregnant should be vaccinated. If there is no evidence of immunity, females who are pregnant should delay immunization until after pregnancy. Unvaccinated health care workers born before 1957 who lack laboratory evidence of measles, mumps, or rubella immunity or laboratory confirmation of disease should consider measles and mumps immunization with 2 doses of MMR vaccine or rubella immunization with 1 dose of MMR vaccine.  Pneumococcal  13-valent conjugate (PCV13) vaccine. When indicated, a person who is uncertain of her immunization history and has no record of immunization should receive the PCV13 vaccine. An adult aged 19 years or older who has certain medical conditions and has not been previously immunized should receive 1 dose of PCV13 vaccine. This PCV13 should be followed with a dose of pneumococcal polysaccharide (PPSV23) vaccine. The PPSV23 vaccine dose should be obtained at least 8 weeks after the dose of PCV13 vaccine. An adult aged 19 years or older who has certain medical conditions and previously received 1 or more doses of PPSV23 vaccine should receive 1 dose of PCV13. The PCV13 vaccine dose should be obtained 1 or more years after the last PPSV23 vaccine dose.  Pneumococcal polysaccharide (PPSV23) vaccine. When PCV13 is also indicated, PCV13 should be obtained first. All adults aged 65 years and older should be immunized. An adult younger than age 65 years who has certain medical conditions should be immunized. Any person who resides in a nursing home or long-term care facility should be immunized. An adult smoker should be immunized. People with an immunocompromised condition and certain other conditions should receive both PCV13 and PPSV23 vaccines. People with human immunodeficiency virus (HIV) infection should be immunized as soon as possible after diagnosis. Immunization during chemotherapy or radiation therapy should be avoided. Routine use of PPSV23 vaccine is not recommended for American Indians, [place] Natives, or people younger than 65 years unless there are medical conditions that require PPSV23 vaccine. When indicated, people who have unknown immunization and have no record of immunization should receive PPSV23 vaccine. One-time revaccination 5 years after the first dose of PPSV23 is recommended for people aged 19 64 years who have chronic kidney failure, nephrotic syndrome, asplenia, or immunocompromised conditions.  People who received 1 2 doses of PPSV23 before age 65 years should receive another dose of PPSV23 vaccine at age 65 years or later if at least 5 years have passed since the previous dose. Doses of PPSV23 are not needed for people immunized with PPSV23 at or after age 65 years.  Meningococcal vaccine. Adults with asplenia or persistent complement component deficiencies should receive 2 doses of quadrivalent meningococcal conjugate (MenACWY-D) vaccine. The doses should be obtained at least 2 months apart. Microbiologists working with certain meningococcal bacteria, military recruits, people at risk during an outbreak, and people who travel to or live in countries with a high rate of meningitis should be immunized. A first-year college student up through age 21 years   who is living in a residence hall should receive a dose if she did not receive a dose on or after her 16th birthday. Adults who have certain high-risk conditions should receive one or more doses of vaccine.  Hepatitis A vaccine. Adults who wish to be protected from this disease, have certain high-risk conditions, work with hepatitis A-infected animals, work in hepatitis A research labs, or travel to or work in countries with a high rate of hepatitis A should be immunized. Adults who were previously unvaccinated and who anticipate close contact with an international adoptee during the first 60 days after arrival in the [place] from a country with a high rate of hepatitis A should be immunized.  Hepatitis B vaccine.  Adults who wish to be protected from this disease, have certain high-risk conditions, may be exposed to blood or other infectious body fluids, are household contacts or sex partners of hepatitis B positive people, are clients or workers in certain care facilities, or travel to or work in countries with a high rate of hepatitis B should be immunized.  Haemophilus influenzae type b (Hib) vaccine. A previously unvaccinated person with  asplenia or sickle cell disease or having a scheduled splenectomy should receive 1 dose of Hib vaccine. Regardless of previous immunization, a recipient of a hematopoietic stem cell transplant should receive a 3-dose series 6 12 months after her successful transplant. Hib vaccine is not recommended for adults with HIV infection.  Preventive Services / Frequency Ages 19 to 39years  Blood pressure check.** / Every 1 to 2 years.  Lipid and cholesterol check.** / Every 5 years beginning at age 20.  Clinical breast exam.** / Every 3 years for women in their 20s and 30s.  BRCA-related cancer risk assessment.** / For women who have family members with a BRCA-related cancer (breast, ovarian, tubal, or peritoneal cancers).  Pap test.** / Every 2 years from ages 21 through 29. Every 3 years starting at age 30 through age 65 or 70 with a history of 3 consecutive normal Pap tests.  HPV screening.** / Every 3 years from ages 30 through ages 65 to 70 with a history of 3 consecutive normal Pap tests.  Hepatitis C blood test.** / For any individual with known risks for hepatitis C.  Skin self-exam. / Monthly.  Influenza vaccine. / Every year.  Tetanus, diphtheria, and acellular pertussis (Tdap, Td) vaccine.** / Consult your health care provider. Pregnant women should receive 1 dose of Tdap vaccine during each pregnancy. 1 dose of Td every 10 years.  Varicella vaccine.** / Consult your health care provider. Pregnant females who do not have evidence of immunity should receive the first dose after pregnancy.  HPV vaccine. / 3 doses over 6 months, if 26 and younger. The vaccine is not recommended for use in pregnant females. However, pregnancy testing is not needed before receiving a dose.  Measles, mumps, rubella (MMR) vaccine.** / You need at least 1 dose of MMR if you were born in 1957 or later. You may also need a 2nd dose. For females of childbearing age, rubella immunity should be determined. If there  is no evidence of immunity, females who are not pregnant should be vaccinated. If there is no evidence of immunity, females who are pregnant should delay immunization until after pregnancy.  Pneumococcal 13-valent conjugate (PCV13) vaccine.** / Consult your health care provider.  Pneumococcal polysaccharide (PPSV23) vaccine.** / 1 to 2 doses if you smoke cigarettes or if you have certain conditions.  Meningococcal   vaccine.** / 1 dose if you are age 19 to 21 years and a first-year college student living in a residence hall, or have one of several medical conditions, you need to get vaccinated against meningococcal disease. You may also need additional booster doses.  Hepatitis A vaccine.** / Consult your health care provider.  Hepatitis B vaccine.** / Consult your health care provider.  Haemophilus influenzae type b (Hib) vaccine.** / Consult your health care provider.  Ages 40 to 64years  Blood pressure check.** / Every 1 to 2 years.  Lipid and cholesterol check.** / Every 5 years beginning at age 20 years.  Lung cancer screening. / Every year if you are aged 55 80 years and have a 30-pack-year history of smoking and currently smoke or have quit within the past 15 years. Yearly screening is stopped once you have quit smoking for at least 15 years or develop a health problem that would prevent you from having lung cancer treatment.  Clinical breast exam.** / Every year after age 40 years.  BRCA-related cancer risk assessment.** / For women who have family members with a BRCA-related cancer (breast, ovarian, tubal, or peritoneal cancers).  Mammogram.** / Every year beginning at age 40 years and continuing for as long as you are in good health. Consult with your health care provider.  Pap test.** / Every 3 years starting at age 30 years through age 65 or 70 years with a history of 3 consecutive normal Pap tests.  HPV screening.** / Every 3 years from ages 30 years through ages 65 to 70  years with a history of 3 consecutive normal Pap tests.  Fecal occult blood test (FOBT) of stool. / Every year beginning at age 50 years and continuing until age 75 years. You may not need to do this test if you get a colonoscopy every 10 years.  Flexible sigmoidoscopy or colonoscopy.** / Every 5 years for a flexible sigmoidoscopy or every 10 years for a colonoscopy beginning at age 50 years and continuing until age 75 years.  Hepatitis C blood test.** / For all people born from 1945 through 1965 and any individual with known risks for hepatitis C.  Skin self-exam. / Monthly.  Influenza vaccine. / Every year.  Tetanus, diphtheria, and acellular pertussis (Tdap/Td) vaccine.** / Consult your health care provider. Pregnant women should receive 1 dose of Tdap vaccine during each pregnancy. 1 dose of Td every 10 years.  Varicella vaccine.** / Consult your health care provider. Pregnant females who do not have evidence of immunity should receive the first dose after pregnancy.  Zoster vaccine.** / 1 dose for adults aged 60 years or older.  Measles, mumps, rubella (MMR) vaccine.** / You need at least 1 dose of MMR if you were born in 1957 or later. You may also need a 2nd dose. For females of childbearing age, rubella immunity should be determined. If there is no evidence of immunity, females who are not pregnant should be vaccinated. If there is no evidence of immunity, females who are pregnant should delay immunization until after pregnancy.  Pneumococcal 13-valent conjugate (PCV13) vaccine.** / Consult your health care provider.  Pneumococcal polysaccharide (PPSV23) vaccine.** / 1 to 2 doses if you smoke cigarettes or if you have certain conditions.  Meningococcal vaccine.** / Consult your health care provider.  Hepatitis A vaccine.** / Consult your health care provider.  Hepatitis B vaccine.** / Consult your health care provider.  Haemophilus influenzae type b (Hib) vaccine.** / Consult  your health care   provider.  Ages 65 years and over  Blood pressure check.** / Every 1 to 2 years.  Lipid and cholesterol check.** / Every 5 years beginning at age 20 years.  Lung cancer screening. / Every year if you are aged 55 80 years and have a 30-pack-year history of smoking and currently smoke or have quit within the past 15 years. Yearly screening is stopped once you have quit smoking for at least 15 years or develop a health problem that would prevent you from having lung cancer treatment.  Clinical breast exam.** / Every year after age 40 years.  BRCA-related cancer risk assessment.** / For women who have family members with a BRCA-related cancer (breast, ovarian, tubal, or peritoneal cancers).  Mammogram.** / Every year beginning at age 40 years and continuing for as long as you are in good health. Consult with your health care provider.  Pap test.** / Every 3 years starting at age 30 years through age 65 or 70 years with 3 consecutive normal Pap tests. Testing can be stopped between 65 and 70 years with 3 consecutive normal Pap tests and no abnormal Pap or HPV tests in the past 10 years.  HPV screening.** / Every 3 years from ages 30 years through ages 65 or 70 years with a history of 3 consecutive normal Pap tests. Testing can be stopped between 65 and 70 years with 3 consecutive normal Pap tests and no abnormal Pap or HPV tests in the past 10 years.  Fecal occult blood test (FOBT) of stool. / Every year beginning at age 50 years and continuing until age 75 years. You may not need to do this test if you get a colonoscopy every 10 years.  Flexible sigmoidoscopy or colonoscopy.** / Every 5 years for a flexible sigmoidoscopy or every 10 years for a colonoscopy beginning at age 50 years and continuing until age 75 years.  Hepatitis C blood test.** / For all people born from 1945 through 1965 and any individual with known risks for hepatitis C.  Osteoporosis screening.** / A  one-time screening for women ages 65 years and over and women at risk for fractures or osteoporosis.  Skin self-exam. / Monthly.  Influenza vaccine. / Every year.  Tetanus, diphtheria, and acellular pertussis (Tdap/Td) vaccine.** / 1 dose of Td every 10 years.  Varicella vaccine.** / Consult your health care provider.  Zoster vaccine.** / 1 dose for adults aged 60 years or older.  Pneumococcal 13-valent conjugate (PCV13) vaccine.** / Consult your health care provider.  Pneumococcal polysaccharide (PPSV23) vaccine.** / 1 dose for all adults aged 65 years and older.  Meningococcal vaccine.** / Consult your health care provider.  Hepatitis A vaccine.** / Consult your health care provider.  Hepatitis B vaccine.** / Consult your health care provider.  Haemophilus influenzae type b (Hib) vaccine.** / Consult your health care provider. ** Family history and personal history of risk and conditions may change your health care provider's recommendations. Document Released: 06/05/2001 Document Revised: 01/28/2013  [employer] Patient Information 2014 [employer]   EXERCISE AND DIET:  We recommended that you start or continue a regular exercise program for good health. Regular exercise means any activity that makes your heart beat faster and makes you sweat.  We recommend exercising at least 30 minutes per day at least 3 days a week, preferably 5.  We also recommend a diet low in fat and sugar / carbohydrates.  Inactivity, poor dietary choices and obesity can cause diabetes, heart attack, stroke, and kidney   damage, among others.     ALCOHOL AND SMOKING:  Women should limit their alcohol intake to no more than 7 drinks/beers/glasses of wine (combined, not each!) per week. Moderation of alcohol intake to this level decreases your risk of breast cancer and liver damage.  ( And of course, no recreational drugs are part of a healthy lifestyle.)  Also, you should not be smoking at all or even being  exposed to second hand smoke. Most people know smoking can cause cancer, and various heart and lung diseases, but did you know it also contributes to weakening of your bones?  Aging of your skin?  Yellowing of your teeth and nails?   CALCIUM AND VITAMIN D:  Adequate intake of calcium and Vitamin D are recommended.  The recommendations for exact amounts of these supplements seem to change often, but generally speaking 600 mg of calcium (either carbonate or citrate) and 800 units of Vitamin D per day seems prudent. Certain women may benefit from higher intake of Vitamin D.  If you are among these women, your doctor will have told you during your visit.     PAP SMEARS:  Pap smears, to check for cervical cancer or precancers,  have traditionally been done yearly, although recent scientific advances have shown that most women can have pap smears less often.  However, every woman still should have a physical exam from her gynecologist or primary care physician every year. It will include a breast check, inspection of the vulva and vagina to check for abnormal growths or skin changes, a visual exam of the cervix, and then an exam to evaluate the size and shape of the uterus and ovaries.  And after 23 years of age, a rectal exam is indicated to check for rectal cancers. We will also provide age appropriate advice regarding health maintenance, like when you should have certain vaccines, screening for sexually transmitted diseases, bone density testing, colonoscopy, mammograms, etc.    MAMMOGRAMS:  All women over 40 years old should have a yearly mammogram. Many facilities now offer a "3D" mammogram, which may cost around $50 extra out of pocket. If possible,  we recommend you accept the option to have the 3D mammogram performed.  It both reduces the number of women who will be called back for extra views which then turn out to be normal, and it is better than the routine mammogram at detecting truly abnormal  areas.     COLONOSCOPY:  Colonoscopy to screen for colon cancer is recommended for all women at age 50.  We know, you hate the idea of the prep.  We agree, BUT, having colon cancer and not knowing it is worse!!  Colon cancer so often starts as a polyp that can be seen and removed at colonscopy, which can quite literally save your life!  And if your first colonoscopy is normal and you have no family history of colon cancer, most women don't have to have it again for 10 years.  Once every ten years, you can do something that may end up saving your life, right?  We will be happy to help you get it scheduled when you are ready.  Be sure to check your insurance coverage so you understand how much it will cost.  It may be covered as a preventative service at no cost, but you should check your particular policy.   

## 2019-01-16 LAB — COMPREHENSIVE METABOLIC PANEL
ALT: 13 IU/L (ref 0–32)
AST: 13 IU/L (ref 0–40)
Albumin/Globulin Ratio: 1.4 (ref 1.2–2.2)
Albumin: 4.1 g/dL (ref 3.9–5.0)
Alkaline Phosphatase: 105 IU/L (ref 39–117)
BUN/Creatinine Ratio: 9 (ref 9–23)
BUN: 7 mg/dL (ref 6–20)
Bilirubin Total: 0.4 mg/dL (ref 0.0–1.2)
CO2: 23 mmol/L (ref 20–29)
Calcium: 9.3 mg/dL (ref 8.7–10.2)
Chloride: 104 mmol/L (ref 96–106)
Creatinine, Ser: 0.77 mg/dL (ref 0.57–1.00)
GFR calc Af Amer: 126 mL/min/{1.73_m2} (ref >59)
GFR calc non Af Amer: 109 mL/min/{1.73_m2} (ref >59)
Globulin, Total: 3 g/dL (ref 1.5–4.5)
Glucose: 75 mg/dL (ref 65–99)
Potassium: 4.2 mmol/L (ref 3.5–5.2)
Sodium: 139 mmol/L (ref 134–144)
Total Protein: 7.1 g/dL (ref 6.0–8.5)

## 2019-01-16 LAB — LIPID PANEL
Chol/HDL Ratio: 3.7 ratio (ref 0.0–4.4)
Cholesterol, Total: 159 mg/dL (ref 100–199)
HDL: 43 mg/dL (ref >39)
LDL Chol Calc (NIH): 101 mg/dL — ABNORMAL HIGH (ref 0–99)
Triglycerides: 79 mg/dL (ref 0–149)
VLDL Cholesterol Cal: 15 mg/dL (ref 5–40)

## 2019-01-16 LAB — CBC WITH DIFFERENTIAL/PLATELET
Basophils Absolute: 0.1 10*3/uL (ref 0.0–0.2)
Basos: 1 %
EOS (ABSOLUTE): 0.1 10*3/uL (ref 0.0–0.4)
Eos: 1 %
Hematocrit: 37.8 % (ref 34.0–46.6)
Hemoglobin: 11.5 g/dL (ref 11.1–15.9)
Immature Grans (Abs): 0 10*3/uL (ref 0.0–0.1)
Immature Granulocytes: 0 %
Lymphocytes Absolute: 3.1 10*3/uL (ref 0.7–3.1)
Lymphs: 30 %
MCH: 24.4 pg — ABNORMAL LOW (ref 26.6–33.0)
MCHC: 30.4 g/dL — ABNORMAL LOW (ref 31.5–35.7)
MCV: 80 fL (ref 79–97)
Monocytes Absolute: 0.8 10*3/uL (ref 0.1–0.9)
Monocytes: 7 %
Neutrophils Absolute: 6.5 10*3/uL (ref 1.4–7.0)
Neutrophils: 61 %
Platelets: 548 10*3/uL — ABNORMAL HIGH (ref 150–450)
RBC: 4.72 x10E6/uL (ref 3.77–5.28)
RDW: 14.6 % (ref 11.7–15.4)
WBC: 10.5 10*3/uL (ref 3.4–10.8)

## 2019-01-16 LAB — HEMOGLOBIN A1C
Est. average glucose Bld gHb Est-mCnc: 108 mg/dL
Hgb A1c MFr Bld: 5.4 % (ref 4.8–5.6)

## 2019-01-16 LAB — VITAMIN D 25 HYDROXY (VIT D DEFICIENCY, FRACTURES): Vit D, 25-Hydroxy: 25 ng/mL — ABNORMAL LOW (ref 30.0–100.0)

## 2019-01-16 LAB — HIV ANTIBODY (ROUTINE TESTING W REFLEX): HIV Screen 4th Generation wRfx: NONREACTIVE

## 2019-01-16 LAB — T3: T3, Total: 168 ng/dL (ref 71–180)

## 2019-01-16 LAB — TSH: TSH: 3.01 u[IU]/mL (ref 0.450–4.500)

## 2019-01-16 LAB — T4, FREE: Free T4: 1.16 ng/dL (ref 0.82–1.77)

## 2019-01-20 ENCOUNTER — Other Ambulatory Visit: Payer: Self-pay

## 2019-01-20 ENCOUNTER — Encounter: Payer: Self-pay | Admitting: Family Medicine

## 2019-01-20 ENCOUNTER — Ambulatory Visit (INDEPENDENT_AMBULATORY_CARE_PROVIDER_SITE_OTHER): Payer: BC Managed Care – PPO | Admitting: Family Medicine

## 2019-01-20 VITALS — Temp 97.1°F

## 2019-01-20 DIAGNOSIS — E785 Hyperlipidemia, unspecified: Secondary | ICD-10-CM | POA: Insufficient documentation

## 2019-01-20 DIAGNOSIS — F3160 Bipolar disorder, current episode mixed, unspecified: Secondary | ICD-10-CM

## 2019-01-20 DIAGNOSIS — E559 Vitamin D deficiency, unspecified: Secondary | ICD-10-CM | POA: Insufficient documentation

## 2019-01-20 DIAGNOSIS — A749 Chlamydial infection, unspecified: Secondary | ICD-10-CM

## 2019-01-20 LAB — GC/CHLAMYDIA PROBE AMP
Chlamydia trachomatis, NAA: POSITIVE — AB
Neisseria Gonorrhoeae by PCR: NEGATIVE

## 2019-01-20 MED ORDER — AZITHROMYCIN 500 MG PO TABS: 1000.0000 mg | ORAL_TABLET | Freq: Once | ORAL | 0 refills | Status: AC

## 2019-01-20 NOTE — Progress Notes (Signed)
Virtual / live video office visit note for [employer], D.O- Primary Care Physician at [facility]   I connected with current patient today and beyond visually recognizing the correct individual, I verified that I am speaking with the correct person using two identifiers.  . Location of the patient: Home . Location of the provider: Office Only the patient (+/- their family members at pt's discretion) and myself were participating in the encounter    - This visit type was conducted due to national recommendations for restrictions regarding the COVID-19 Pandemic (e.g. social distancing) in an effort to limit this patient's exposure and mitigate transmission in our community.  This format is felt to be most appropriate for this patient at this time.   - The patient did have access to video technology today   - No physical exam could be performed with this format, beyond that communicated to [place] by the patient/ family members as noted.   - Additionally my office staff/ schedulers discussed with the patient that there may be a monetary charge related to this service, depending on patient's medical insurance.   The patient expressed understanding, and agreed to proceed.       History of Present Illness:  Patient states feeling tired today.  "I am so done with ... we need a fall break.  They're not giving [place] a fall break; we need a fall break."  Notes today recently talking to therapy and psychiatry and accepting something she's denied for 13 years of her life: "the fact is I am transgender".    Patient states wanted to talk today about steps to take regarding this.  Says "started during the spring where I was basically being trapped in a room and thinking about myself a lot."   Notes starting therapy, researching more about the LGBTQ community, and "basically I ran across videos from people who have gender dysphoria, and people also explaining the difference between body dysphoria and  gender dysphoria."   States "I understand the body dysphoria, but the gender dysphoria ... 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  Patient notes next meeting with psychiatry is not next week but the following week.  Notes with therapist, "deep-diving into some heavier situations with me and my family."  Says "it's about to hit the wall when I come out, and it's not going to be fun."  Patient states mainly worried about losing grandmother after coming out.  The patient is also worried about father's behavior.  States he has mood swings, doesn't take his medicine always, and uses emotionally manipulative tactics to control his family members.  Patient is very frustrated with the way he does not take care of himself.  Says "right now how I feel, I do want to transition, so I'm an over planner and I also wanted to talk to you."    Says "when I do come out, I'm probably going to have to go onto my school's insurance."    Also reviewed recent labs with pt today.   Recent Results (from the past 2160 hour(s))  HIV Antibody (routine testing w rflx)     Status: None  Collection Time: 01/15/19  8:55 AM  Result Value Ref Range   HIV Screen 4th Generation wRfx Non Reactive Non Reactive  CBC with Differential/Platelet     Status: Abnormal   Collection Time: 01/15/19  8:55 AM  Result Value Ref Range   WBC 10.5 3.4 - 10.8 x10E3/uL   RBC 4.72 3.77 - 5.28 x10E6/uL   Hemoglobin 11.5 11.1 - 15.9 g/dL   Hematocrit 37.8 34.0 - 46.6 %   MCV 80 79 - 97 fL   MCH 24.4 (L) 26.6 - 33.0 pg   MCHC 30.4 (L) 31.5 - 35.7 g/dL   RDW 14.6 11.7 - 15.4 %   Platelets 548 (H) 150 - 450  x10E3/uL   Neutrophils 61 Not Estab. %   Lymphs 30 Not Estab. %   Monocytes 7 Not Estab. %   Eos 1 Not Estab. %   Basos 1 Not Estab. %   Neutrophils Absolute 6.5 1.4 - 7.0 x10E3/uL   Lymphocytes Absolute 3.1 0.7 - 3.1 x10E3/uL   Monocytes Absolute 0.8 0.1 - 0.9 x10E3/uL   EOS (ABSOLUTE) 0.1 0.0 - 0.4 x10E3/uL   Basophils Absolute 0.1 0.0 - 0.2 x10E3/uL   Immature Granulocytes 0 Not Estab. %   Immature Grans (Abs) 0.0 0.0 - 0.1 x10E3/uL  Comprehensive metabolic panel     Status: None   Collection Time: 01/15/19  8:55 AM  Result Value Ref Range   Glucose 75 65 - 99 mg/dL   BUN 7 6 - 20 mg/dL   Creatinine, Ser 0.77 0.57 - 1.00 mg/dL   GFR calc non Af Amer 109 >59 mL/min/1.73   GFR calc Af Amer 126 >59 mL/min/1.73   BUN/Creatinine Ratio 9 9 - 23   Sodium 139 134 - 144 mmol/L   Potassium 4.2 3.5 - 5.2 mmol/L   Chloride 104 96 - 106 mmol/L   CO2 23 20 - 29 mmol/L   Calcium 9.3 8.7 - 10.2 mg/dL   Total Protein 7.1 6.0 - 8.5 g/dL   Albumin 4.1 3.9 - 5.0 g/dL   Globulin, Total 3.0 1.5 - 4.5 g/dL   Albumin/Globulin Ratio 1.4 1.2 - 2.2   Bilirubin Total 0.4 0.0 - 1.2 mg/dL   Alkaline Phosphatase 105 39 - 117 IU/L   AST 13 0 - 40 IU/L   ALT 13 0 - 32 IU/L  Hemoglobin A1c     Status: None   Collection Time: 01/15/19  8:55 AM  Result Value Ref Range   Hgb A1c MFr Bld 5.4 4.8 - 5.6 %    Comment:          Prediabetes: 5.7 - 6.4          Diabetes: >6.4          Glycemic control for adults with diabetes: <7.0    Est. average glucose Bld gHb Est-mCnc 108 mg/dL  Lipid panel     Status: Abnormal   Collection Time: 01/15/19  8:55 AM  Result Value Ref Range   Cholesterol, Total 159 100 - 199 mg/dL   Triglycerides 79 0 - 149 mg/dL   HDL 43 >39 mg/dL   VLDL Cholesterol Cal 15 5 - 40 mg/dL   LDL Chol Calc (NIH) 101 (H) 0 - 99 mg/dL   Chol/HDL Ratio 3.7 0.0 - 4.4 ratio    Comment:  T. Chol/HDL Ratio                                             Men  Women                                1/2 Avg.Risk  3.4    3.3                                   Avg.Risk  5.0    4.4                                2X Avg.Risk  9.6    7.1                                3X Avg.Risk 23.4   11.0   T3     Status: None   Collection Time: 01/15/19  8:55 AM  Result Value Ref Range   T3, Total 168 71 - 180 ng/dL  T4, free     Status: None   Collection Time: 01/15/19  8:55 AM  Result Value Ref Range   Free T4 1.16 0.82 - 1.77 ng/dL  TSH     Status: None   Collection Time: 01/15/19  8:55 AM  Result Value Ref Range   TSH 3.010 0.450 - 4.500 uIU/mL  VITAMIN D 25 Hydroxy (Vit-D Deficiency, Fractures)     Status: Abnormal   Collection Time: 01/15/19  8:55 AM  Result Value Ref Range   Vit D, 25-Hydroxy 25.0 (L) 30.0 - 100.0 ng/mL    Comment: Vitamin D deficiency has been defined by the [place] and an Endocrine Society practice guideline as a level of serum 25-OH vitamin D less than 20 ng/mL (1,2). The Endocrine Society went on to further define vitamin D insufficiency as a level between 21 and 29 ng/mL (2). 1. IOM (Institute of Medicine). 2010. Dietary reference    intakes for calcium and D. [place]: The    [employer]. 2. Holick MF, Binkley [place], Bischoff-Ferrari HA, et al.    Evaluation, treatment, and prevention of vitamin D    deficiency: an Endocrine Society clinical practice    guideline. JCEM. 2011 Jul; 96(7):1911-30.   GC/Chlamydia Probe Amp     Status: Abnormal   Collection Time: 01/15/19  9:20 AM   Specimen: Urine   UR  Result Value Ref Range   Chlamydia trachomatis, NAA Positive (A) Negative   Neisseria Gonorrhoeae by PCR Negative Negative        Depression screen [facility] 2/9 01/15/2019 12/15/2018 11/05/2018 10/06/2018 06/09/2018  Decreased Interest 0 1 1 2 2   Down, Depressed, Hopeless 1 1 3 3 2   PHQ - 2 Score 1 2 4 5 4   Altered sleeping 1 0 3 3 2   Tired, decreased energy 2 0 2 2 2   Change in appetite 2 0 2 2 2   Feeling  bad or failure about yourself  2 1 3 2 2   Trouble concentrating 1 1 1 1 2   Moving slowly or fidgety/restless 2 0 2 2 1   Suicidal  thoughts 0 0 2 2 0  PHQ-9 Score 11 4 19 19 15   Difficult doing work/chores Somewhat difficult - Very difficult Very difficult Very difficult    GAD 7 : Generalized Anxiety Score 11/05/2018  Nervous, Anxious, on Edge 3  Control/stop worrying 3  Worry too much - different things 3  Trouble relaxing 3  Restless 2  Easily annoyed or irritable 3  Afraid - awful might happen 1  Total GAD 7 Score 18  Anxiety Difficulty Very difficult     Impression and Recommendations:    1. Bipolar disorder, mixed ([place])   2. Hyperlipidemia, unspecified hyperlipidemia type   3. Morbid obesity ([place])   4. Vitamin D deficiency   5. Chlamydia- vaginal infection    Concerns about Gender Dysphoria  - Advised patient to ask therapist and psychiatry for referrals to specific therapists that deal with transgender individuals.  - Encouraged patient to explore all feelings related to transitioning, and be sure to establish with a counselor with extensive experience in the realm of transitioning.  - Discussed referral to a specialist provider PRN to handle need for hormonally-related changes. ---> Gave her the name of Dr. Delman Cheadle of [place] [facility] -this is a doctor who specifically does hormone replacement therapies ---> Also gave patient information for mental health of [place] support group for LGBTQ plus community.  Please see separate message sent to patient for specific details of this info - Message will be sent via MyChart to pt regarding group therapy and gender specialist.  - Advised patient to talk to front desk about possible upcoming changes in insurance coverage.  - Lengthy discussion was held with patient today and all questions were answered. - Resources provided to patient today PRN, such as contacts for support groups. - Discussed critical need for a loving,  supportive environment full of people to help guide the patient during this time.   Hyperlipidemia - Elevated LDL Last Check - Discussed that LDL was elevated last check on 01/15/2019 at 101.   - LDL has improved since August of 2019, when patient's LDL was measured at 142.  - Encouraged ongoing prudent health habits.  - Dietary changes such as low saturated & trans fat and low carb diets discussed with patient.  Encouraged regular exercise and weight loss when appropriate.   - Will continue to monitor and re-check as recommended.   Vitamin D Deficiency - Vitamin D measured at 25.0 last check. - Advised patient to begin supplementation as recommended. - Discussed taking 5000 IU's daily OTC.  See med list. - Will continue to monitor and re-check as advised.   Positive Chlamydia Test - Discussed that recent chlamydia test resulted positive. - Discussed need to treat with antibiotics. - Antibiotics prescribed today.  See med list. - discussed she needs to discuss with each of her recent partners and have those partners go get tested, as well as their partners  - Advised patient to obtain regular sexual health screening. - Discussed need for STD testing with every new partner prior to becoming sexually active. - Prudent sexual health habits extensively discussed.  Education provided and all questions answered.  - Advised patient to avoid sexual interaction for two weeks at least. - Patient knows to let all partners know about positive test result.  - Will continue to monitor.   Recommendations - Patient believes last pap smear was obtained at [facility] in 2019 w her GYN doc. - Per pt, confirms following up with [facility]. -  Patient knows to make follow up with OBGYN to do pap smear q 54yrs if N or sooner if advised  - Advised patient that future STD screening may be obtained either through OBGYN or clinic here.  - Patient desires OV in near future for follow-up.  - As  part of my medical decision making, I reviewed the following data within the [place] History obtained from pt /family, CMA notes reviewed and incorporated if applicable, Labs reviewed, Radiograph/ tests reviewed if applicable and OV notes from prior OV's with me, as well as other specialists she/he has seen since seeing me last, were all reviewed and used in my medical decision making process today.   - Additionally, discussion had with patient regarding txmnt plan, their biases about that plan etc were used in my medical decision making today.   - The patient agreed with the plan and demonstrated an understanding of the instructions.   No barriers to understanding were identified.   - Red flag symptoms and signs discussed in detail.  Patient expressed understanding regarding what to do in case of emergency\ urgent symptoms.  The patient was advised to call back or seek an in-person evaluation if the symptoms worsen or if the condition fails to improve as anticipated.   Return for keep appt 74mo.    Meds ordered this encounter  Medications  . azithromycin (ZITHROMAX) 500 MG tablet    Sig: Take 2 tablets (1,000 mg total) by mouth once for 1 dose.    Dispense:  2 tablet    Refill:  0    Note:  This note was prepared with assistance of Dragon voice recognition software. Occasional wrong-word or sound-a-like substitutions may have occurred due to the inherent limitations of voice recognition software.  This document serves as a record of services personally performed by Mellody Dance, DO. It was created on her behalf by Toni Amend, a trained medical scribe. The creation of this record is based on the scribe's personal observations and the provider's statements to them.   I have reviewed the above medical documentation for accuracy and completeness and I concur.  Mellody Dance, DO 01/22/2019 10:49 AM        Patient Care Team    Relationship Specialty  Notifications Start End  Mellody Dance, DO PCP - General Family Medicine  10/06/18   Chucky May, MD Consulting Physician Psychiatry  12/09/18     -Vitals obtained; medications/ allergies reconciled;  personal medical, social, Sx etc.histories were updated by CMA, reviewed by me and are reflected in chart  Patient Active Problem List   Diagnosis Date Noted  . Chlamydia- vaginal infection 01/25/2019  . Hyperlipidemia 01/20/2019  . Vitamin D deficiency 01/20/2019  . PTSD (post-traumatic stress disorder)   . Bipolar disorder, mixed ([place])   . Thoracic back pain 06/09/2018  . Birth control counseling 06/09/2018  . Depression, recurrent ([place]) 11/20/2017  . H/O borderline personality disorder 11/20/2017  . Healthcare maintenance 11/20/2017  . Anxiety 11/20/2017  . Morbid obesity ([place]) 11/20/2017  . Borderline personality disorder ([place]) 11/20/2017     Current Meds  Medication Sig  . amphetamine-dextroamphetamine (ADDERALL XR) 20 MG 24 hr capsule Take 1 capsule by mouth daily.  . ARIPiprazole (ABILIFY) 2 MG tablet Take 2 mg by mouth daily.  . Cholecalciferol (VITAMIN D-3) 125 MCG (5000 UT) TABS Take 1 tablet by mouth daily.  . phentermine 37.5 MG capsule Take 37.5 mg by mouth every morning.  . venlafaxine XR (EFFEXOR-XR) 150  MG 24 hr capsule Take 150 mg by mouth daily with breakfast.     No Known Allergies   ROS:  See above HPI for pertinent positives and negatives   Objective:   Temperature (!) 97.1 F (36.2 C), temperature source Oral, last menstrual period 01/05/2019.  (if some vitals are omitted, this means that patient was UNABLE to obtain them even though they were asked to get them prior to [place] today.  They were asked to call us at their earliest convenience with these once obtained.)  General: A & O * 3; visually in no acute distress; in usual state of health.  Skin: Visible skin appears normal and pt's usual skin color HEENT:  EOMI, head is normocephalic and  atraumatic.  Sclera are anicteric. Neck has a good range of motion.  Lips are noncyanotic Chest: normal chest excursion and movement Respiratory: speaking in full sentences, no conversational dyspnea; no use of accessory muscles Psych: insight good, mood- appears full

## 2019-01-25 DIAGNOSIS — A749 Chlamydial infection, unspecified: Secondary | ICD-10-CM | POA: Insufficient documentation

## 2019-02-03 NOTE — Progress Notes (Signed)
Subjective:    Patient ID: Anna [name], female    DOB: [birth year], 23 y.o.   MRN: [id number]  HPI :  Anna [name] presents with L knee pain 4 days ago. She denies acute trauma/injury. She reports standing up on [employer] bus and putting on her back pack and felt "a pop and flash of pain" in L knee. She was able to ambulate off bus without assistance. She now states "my knee just doesn't feel right, neither does my entire body".   She is vague about where the pain is located, first endorsed pain over patella then felt that pain was located over popliteal area, then states "my entire left leg will have shooting pain". She reports intermittent pain, cannot describe it, however can rate it 5/10. She has used ice and has been ambulating with a cane. She reports bending, walking, and rest worsens pain. She reports ice will slightly reduce pain. She has not used any OTC pain control and states "my back pain treatments have made me immune to pain control". She denies instability issues.  Patient Care Team    Relationship Specialty Notifications Start End  Mellody Dance, DO PCP - General Family Medicine  10/06/18   Chucky May, [place] Physician Psychiatry  12/09/18     Patient Active Problem List   Diagnosis Date Noted  . Left knee pain 02/04/2019  . Chlamydia- vaginal infection 01/25/2019  . Hyperlipidemia 01/20/2019  . Vitamin D deficiency 01/20/2019  . PTSD (post-traumatic stress disorder)   . Bipolar disorder, mixed ([place])   . Thoracic back pain 06/09/2018  . Birth control counseling 06/09/2018  . Depression, recurrent ([place]) 11/20/2017  . H/O borderline personality disorder 11/20/2017  . Healthcare maintenance 11/20/2017  . Anxiety 11/20/2017  . Morbid obesity ([place]) 11/20/2017  . Borderline personality disorder ([place]) 11/20/2017     Past Medical History:  Diagnosis Date  . Anxiety   . Borderline personality disorder ([place])   . Depression   . Enlarged heart   .  Pneumonia   . PTSD (post-traumatic stress disorder)      History reviewed. No pertinent surgical history.   Family History  Problem Relation Age of Onset  . Clotting disorder Mother   . Cancer Father   . COPD Father        skin  . Depression Father   . Healthy Sister   . Healthy Brother   . Cancer Maternal Grandmother        breast  . Colon cancer Paternal Grandmother      Social History   Substance and Sexual Activity  Drug Use Not Currently   Comment: 2017 Marijuana     Social History   Substance and Sexual Activity  Alcohol Use Yes   Comment: occassionally     Social History   Tobacco Use  Smoking Status Former Smoker  . Packs/day: 0.25  . Years: 3.00  . Pack years: 0.75  . Types: Cigarettes  . Quit date: 10/21/2017  . Years since quitting: 1.2  Smokeless Tobacco Never Used     Outpatient Encounter Medications as of 02/04/2019  Medication Sig  . amphetamine-dextroamphetamine (ADDERALL XR) 20 MG 24 hr capsule Take 1 capsule by mouth daily.  . ARIPiprazole (ABILIFY) 2 MG tablet Take 2 mg by mouth daily.  . Cholecalciferol (VITAMIN D-3) 125 MCG (5000 UT) TABS Take 1 tablet by mouth daily.  . phentermine 37.5 MG capsule Take 37.5 mg by mouth every morning.  . venlafaxine XR (EFFEXOR-XR) 150  MG 24 hr capsule Take 150 mg by mouth daily with breakfast.  . diclofenac sodium (VOLTAREN) 1 % GEL Apply 4 g topically 4 (four) times daily.   No facility-administered encounter medications on file as of 02/04/2019.     Allergies: Patient has no known allergies.  Body mass index is 54.11 kg/m.  Blood pressure 114/66, pulse 82, temperature 98 F (36.7 C), temperature source Oral, height 5' 3.75" (1.619 m), weight (!) 312 lb 12.8 oz (141.9 kg), last menstrual period 01/05/2019, SpO2 98 %. Review of Systems  Constitutional: Positive for activity change.  Respiratory: Negative for cough, chest tightness, shortness of breath, wheezing and stridor.    Cardiovascular: Negative for chest pain, palpitations and leg swelling.  Musculoskeletal: Positive for arthralgias, gait problem and myalgias.       Objective:   Physical Exam Vitals signs and nursing note reviewed.  Constitutional:      General: She is not in acute distress.    Appearance: She is obese. She is not ill-appearing, toxic-appearing or diaphoretic.  Musculoskeletal:        General: Tenderness present. No deformity.     Left hip: Normal.     Right knee: Normal.     Left knee: She exhibits decreased range of motion. She exhibits no swelling and no effusion. Tenderness found. Patellar tendon tenderness noted.     Left ankle: Normal.     Comments: Unable to check for joint laxity due to pt's level of discomfort, guarding.  Skin:    General: Skin is warm and dry.  Neurological:     Mental Status: She is alert and oriented to person, place, and time.  Psychiatric:        Mood and Affect: Mood normal.        Behavior: Behavior normal.        Thought Content: Thought content normal.        Judgment: Judgment normal.       Assessment & Plan:   1. Pain in lateral portion of left knee   2. Acute pain of left knee     Left knee pain Please pick up crutches- keep weight bearing to min on left leg. Please use Volteran gel as directed. Use OTC Acetaminophen and Ibuprofen as needed. ? Apply ice to left knee- Put ice in a plastic bag. ? Place a towel between your skin and the bag. Leave the ice on for 20 minutes, 2-3 times a day Xrays ordered- please proceed to imaging center to complete.  We will call you with results to imaging and recommend follow-up at that time. Continue to social distance and wear a mask when in public.  FOLLOW-UP:  Return if symptoms worsen or fail to improve.

## 2019-02-04 ENCOUNTER — Encounter: Payer: Self-pay | Admitting: Adult Health

## 2019-02-04 ENCOUNTER — Other Ambulatory Visit: Payer: Self-pay

## 2019-02-04 ENCOUNTER — Ambulatory Visit (INDEPENDENT_AMBULATORY_CARE_PROVIDER_SITE_OTHER): Payer: BC Managed Care – PPO | Admitting: Adult Health

## 2019-02-04 VITALS — BP 114/66 | HR 82 | Temp 98.0°F | Ht 63.75 in | Wt 312.8 lb

## 2019-02-04 DIAGNOSIS — M25562 Pain in left knee: Secondary | ICD-10-CM | POA: Insufficient documentation

## 2019-02-04 MED ORDER — DICLOFENAC SODIUM 1 % TD GEL: 4.0000 g | Freq: Four times a day (QID) | TRANSDERMAL | 0 refills | Status: DC

## 2019-02-04 NOTE — Assessment & Plan Note (Signed)
Please pick up crutches- keep weight bearing to min on left leg. Please use Volteran gel as directed. Use OTC Acetaminophen and Ibuprofen as needed. ? Apply ice to left knee- Put ice in a plastic bag. ? Place a towel between your skin and the bag. Leave the ice on for 20 minutes, 2-3 times a day Xrays ordered- please proceed to imaging center to complete.  We will call you with results to imaging and recommend follow-up at that time. Continue to social distance and wear a mask when in public.

## 2019-02-04 NOTE — Patient Instructions (Addendum)
Acute Knee Pain, Adult Many things can cause knee pain. Sometimes, knee pain is sudden (acute) and may be caused by damage, swelling, or irritation of the muscles and tissues that support your knee. The pain often goes away on its own with time and rest. If the pain does not go away, tests may be done to find out what is causing the pain. Follow these instructions at home: Pay attention to any changes in your symptoms. Take these actions to relieve your pain. If you have a knee sleeve or brace:   Wear the sleeve or brace as told by your doctor. Remove it only as told by your doctor.  Loosen the sleeve or brace if your toes: ? Tingle. ? Become numb. ? Turn cold and blue.  Keep the sleeve or brace clean.  If the sleeve or brace is not waterproof: ? Do not let it get wet. ? Cover it with a watertight covering when you take a bath or shower. Activity  Rest your knee.  Do not do things that cause pain.  Avoid activities where both feet leave the ground at the same time (high-impact activities). Examples are running, jumping rope, and doing jumping jacks.  Work with a physical therapist to make a safe exercise program, as told by your doctor. Managing pain, stiffness, and swelling   If told, put ice on the knee: ? Put ice in a plastic bag. ? Place a towel between your skin and the bag. ? Leave the ice on for 20 minutes, 2-3 times a day.  If told, put pressure (compression) on your injured knee to control swelling, give support, and help with discomfort. Compression may be done with an elastic bandage. General instructions  Take all medicines only as told by your doctor.  Raise (elevate) your knee while you are sitting or lying down. Make sure your knee is higher than your heart.  Sleep with a pillow under your knee.  Do not use any products that contain nicotine or tobacco. These include cigarettes, e-cigarettes, and chewing tobacco. These products may slow down healing. If  you need help quitting, ask your doctor.  If you are overweight, work with your doctor and a food expert (dietitian) to set goals to lose weight. Being overweight can make your knee hurt more.  Keep all follow-up visits as told by your doctor. This is important. Contact a doctor if:  The knee pain does not stop.  The knee pain changes or gets worse.  You have a fever along with knee pain.  Your knee feels warm when you touch it.  Your knee gives out or locks up. Get help right away if:  Your knee swells, and the swelling gets worse.  You cannot move your knee.  You have very bad knee pain. Summary  Many things can cause knee pain. The pain often goes away on its own with time and rest.  Your doctor may do tests to find out the cause of the pain.  Pay attention to any changes in your symptoms. Relieve your pain with rest, medicines, light activity, and use of ice.  Get help right away if you cannot move your knee or your knee pain is very bad. This information is not intended to replace advice given to you by your health care provider. Make sure you discuss any questions you have with your health care provider. Document Released: 07/06/2008 Document Revised: 09/19/2017 Document Reviewed: 09/19/2017 Elsevier Patient Education  2020 [employer].  Please  pick up crutches- keep weight bearing to min on left leg. Please use Volteran gel as directed. Use OTC Acetaminophen and Ibuprofen as needed. ? Apply ice to left knee- Put ice in a plastic bag. ? Place a towel between your skin and the bag. Leave the ice on for 20 minutes, 2-3 times a day Xrays ordered- please proceed to imaging center to complete.  We will call you with results to imaging and recommend follow-up at that time. Continue to social distance and wear a mask when in public. FEEL BETTER!

## 2019-03-26 ENCOUNTER — Ambulatory Visit: Payer: BC Managed Care – PPO | Admitting: Family Medicine

## 2019-05-08 ENCOUNTER — Telehealth: Payer: Self-pay | Admitting: Family Medicine

## 2019-05-08 NOTE — Telephone Encounter (Signed)
Patient called states she has been experiencing some chest/ sternum pain & ask for appt to see PCP---- Advised Fridays office hrs for provider only till [place] pt to proceed Immediately to nearest Urgent Care or Emergency Dept for treatment--patient agreeable.  --FYI to medical asst .  -glh

## 2019-05-11 NOTE — Telephone Encounter (Signed)
For your review. AS, CMA

## 2019-08-06 ENCOUNTER — Ambulatory Visit: Payer: BC Managed Care – PPO | Attending: Internal Medicine

## 2019-08-06 DIAGNOSIS — Z23 Encounter for immunization: Secondary | ICD-10-CM

## 2019-08-06 NOTE — Progress Notes (Signed)
   Covid-19 Vaccination Clinic  Name:  Anna [name]    MRN: [id number] DOB: 16-Jun-1995  08/06/2019  Anna [name] was observed post Covid-19 immunization for 15 minutes without incident. She was provided with Vaccine Information Sheet and instruction to access the V-Safe system.   Anna [name] was instructed to call 911 with any severe reactions post vaccine: Marland Kitchen Difficulty breathing  . Swelling of face and throat  . A fast heartbeat  . A bad rash all over body  . Dizziness and weakness   Immunizations Administered    Name Date Dose VIS Date Route   Pfizer COVID-19 Vaccine 08/06/2019  4:03 PM 0.3 mL 04/03/2019 Intramuscular   Manufacturer: [place]   Lot: [id number]   [place]: [id number]

## 2019-08-31 ENCOUNTER — Ambulatory Visit: Payer: BC Managed Care – PPO | Attending: Internal Medicine

## 2019-08-31 DIAGNOSIS — Z23 Encounter for immunization: Secondary | ICD-10-CM

## 2019-08-31 NOTE — Progress Notes (Signed)
   Covid-19 Vaccination Clinic  Name:  Anna [name]    MRN: [id number] DOB: 1996-01-07  08/31/2019  [name] was observed post Covid-19 immunization for 15 minutes without incident. She was provided with Vaccine Information Sheet and instruction to access the V-Safe system.   [name] was instructed to call 911 with any severe reactions post vaccine: Marland Kitchen Difficulty breathing  . Swelling of face and throat  . A fast heartbeat  . A bad rash all over body  . Dizziness and weakness   Immunizations Administered    Name Date Dose VIS Date Route   Pfizer COVID-19 Vaccine 08/31/2019  3:52 PM 0.3 mL 06/17/2018 Intramuscular   Manufacturer: [place]   Lot: [id number]   [place]: [id number]

## 2020-01-19 ENCOUNTER — Other Ambulatory Visit: Payer: Self-pay

## 2020-01-19 ENCOUNTER — Ambulatory Visit (INDEPENDENT_AMBULATORY_CARE_PROVIDER_SITE_OTHER): Payer: BC Managed Care – PPO | Admitting: Physician Assistant

## 2020-01-19 VITALS — BP 141/78 | HR 84 | Ht 64.0 in | Wt 316.0 lb

## 2020-01-19 DIAGNOSIS — Z111 Encounter for screening for respiratory tuberculosis: Secondary | ICD-10-CM | POA: Diagnosis not present

## 2020-01-19 NOTE — Patient Instructions (Signed)
Tuberculin Skin Test Why am I having this test? The tuberculin skin test is used to check whether a person has been exposed to the bacteria that causes tuberculosis (TB) (Mycobacterium tuberculosis). Tuberculosis is a bacterial infection that usually affects the lungs but can affect other parts of the body. You may have a tuberculin skin test if:  You have possible symptoms of TB, such as: ? Coughing up blood, mucus from the lungs (sputum), or both. ? A cough that lasts three weeks or longer. ? Chest pain, or pain while breathing or coughing. ? Unexplained weight loss. ? Fatigue and weakness. ? Fever, sweating, and chills. ? Loss of appetite.  You are at high risk for getting TB. You may be at high risk if you: ? Inject illegal drugs or share needles. ? Have HIV or other diseases that affect the disease-fighting (immune) system. ? Work in a health care facility. ? Live in a high-risk community, such as a homeless shelter, nursing home, or correctional facility. ? Have had contact with someone who has TB. ? Are from or have traveled to a country where TB is common. If you are at high risk, you may need to have regular TB screenings. TB screening may be required when starting a new job, such as becoming a Public house manager or a Pharmacist, hospital. Colleges or universities may require TB screening for new students. What is being tested? This test checks for the presence of TB antibodies in the body. Antibodies are a type of cell that is part of the body's immune system. After you get an infection, your body makes antibodies that stay in your body after you recover and protect you from getting the same infection again. Tell a health care provider about:  Any allergies you have.  All medicines you are taking, including vitamins, herbs, eye drops, creams, and over-the-counter medicines.  Any blood disorders you have.  Any surgeries you have had.  Any medical conditions you have.  Whether you are  pregnant or may be pregnant. What happens during the test?  Your health care provider will inject a solution called PPD (purified protein derivative) under the first layer of skin on your arm. This causes a small, blister-like bump to form over the area temporarily. PPD is made from the bacteria that causes TB. PPD causes your immune system to react, but it does not get you sick with TB. You may feel mild stinging as this happens. Afterward, the area may itch or burn. How are the results reported? To get your test results, you will need to see your health care provider again within 2-3 days after you received the injection. It is important to follow your health care provider's instructions about when to be seen again. If you are not seen within 2-3 days, you may need to have the test repeated. At your follow-up visit, your health care provider will measure the area where the PPD was injected to see if the bump has gotten larger due to swelling. Your results will be reported as positive or negative:  If the bump has disappeared or is small, your test result is negative. Negative means that you do not have the antibodies.  If the bump is large, your test result is positive. Positive means that you have the antibodies. Swelling is caused by the antibodies reacting with the PPD. The skin may also turn red around the bump. A false-positive result can occur. A false positive is incorrect because it means that a  condition is present when it is not. A false-negative result can occur. A false negative is incorrect because it means that a condition is not present when it is. False negatives are rare and are more likely to occur in older people and in people who have weakened immune systems. What do the results mean? A negative result means that it is unlikely that you have TB or that you have been exposed to TB bacteria. This test may be repeated, or you may have a blood test to check for TB. This is because  your body may not react to the tuberculin skin test until several weeks after exposure to TB bacteria. A positive result means that you have been exposed to TB, and you may need more tests to determine if you have:  Active TB, also called TB disease. This means that you have TB symptoms and your infection can spread to others (you are contagious).  Latent TB. This means that you do not have any symptoms of TB and you are not contagious. Latent TB can turn into active TB. Talk with your health care provider about what your results mean. Questions to ask your health care provider Ask your health care provider, or the department that is doing the test:  When will my results be ready?  How will I get my results?  What are my treatment options?  What other tests do I need?  What are my next steps? Summary  The tuberculin skin test is used to check whether a person has been exposed to the bacteria that causes tuberculosis (TB).  Your health care provider will inject a solution known as PPD (purified protein derivative) under the first layer of skin on your arm.  After 2-3 days, your health care provider will measure the area where the PPD was injected to see if the bump has gotten larger due to swelling.  Your results will be reported as positive or negative. A positive result means that you have been exposed to TB. A negative result means that it is unlikely that you have TB or that you have been exposed to TB bacteria. This information is not intended to replace advice given to you by your health care provider. Make sure you discuss any questions you have with your health care provider. Document Revised: 03/22/2017 Document Reviewed: 12/19/2016 Elsevier Patient Education  [place].

## 2020-01-19 NOTE — Progress Notes (Signed)
Patient tolerated injection well without complications. AS, CMA

## 2020-01-21 LAB — TB SKIN TEST
Induration: 0 mm
TB Skin Test: NEGATIVE

## 2020-02-26 ENCOUNTER — Other Ambulatory Visit: Payer: Self-pay

## 2020-02-26 ENCOUNTER — Ambulatory Visit (INDEPENDENT_AMBULATORY_CARE_PROVIDER_SITE_OTHER): Payer: BC Managed Care – PPO | Admitting: Physician Assistant

## 2020-02-26 ENCOUNTER — Encounter: Payer: Self-pay | Admitting: Physician Assistant

## 2020-02-26 VITALS — BP 104/67 | HR 74 | Temp 98.2°F | Ht 63.5 in | Wt 310.4 lb

## 2020-02-26 DIAGNOSIS — Z0000 Encounter for general adult medical examination without abnormal findings: Secondary | ICD-10-CM | POA: Diagnosis not present

## 2020-02-26 DIAGNOSIS — F339 Major depressive disorder, recurrent, unspecified: Secondary | ICD-10-CM

## 2020-02-26 DIAGNOSIS — Z118 Encounter for screening for other infectious and parasitic diseases: Secondary | ICD-10-CM | POA: Diagnosis not present

## 2020-02-26 DIAGNOSIS — E559 Vitamin D deficiency, unspecified: Secondary | ICD-10-CM

## 2020-02-26 DIAGNOSIS — Z23 Encounter for immunization: Secondary | ICD-10-CM | POA: Diagnosis not present

## 2020-02-26 DIAGNOSIS — Z1159 Encounter for screening for other viral diseases: Secondary | ICD-10-CM

## 2020-02-26 DIAGNOSIS — F3160 Bipolar disorder, current episode mixed, unspecified: Secondary | ICD-10-CM

## 2020-02-26 DIAGNOSIS — Z114 Encounter for screening for human immunodeficiency virus [HIV]: Secondary | ICD-10-CM

## 2020-02-26 DIAGNOSIS — E785 Hyperlipidemia, unspecified: Secondary | ICD-10-CM

## 2020-02-26 NOTE — Patient Instructions (Signed)
Preventive Care 21-24 Years Old, Female Preventive care refers to visits with your health care provider and lifestyle choices that can promote health and wellness. This includes:  A yearly physical exam. This may also be called an annual well check.  Regular dental visits and eye exams.  Immunizations.  Screening for certain conditions.  Healthy lifestyle choices, such as eating a healthy diet, getting regular exercise, not using drugs or products that contain nicotine and tobacco, and limiting alcohol use. What can I expect for my preventive care visit? Physical exam Your health care provider will check your:  Height and weight. This may be used to calculate body mass index (BMI), which tells if you are at a healthy weight.  Heart rate and blood pressure.  Skin for abnormal spots. Counseling Your health care provider may ask you questions about your:  Alcohol, tobacco, and drug use.  Emotional well-being.  Home and relationship well-being.  Sexual activity.  Eating habits.  Work and work environment.  Method of birth control.  Menstrual cycle.  Pregnancy history. What immunizations do I need?  Influenza (flu) vaccine  This is recommended every year. Tetanus, diphtheria, and pertussis (Tdap) vaccine  You may need a Td booster every 10 years. Varicella (chickenpox) vaccine  You may need this if you have not been vaccinated. Human papillomavirus (HPV) vaccine  If recommended by your health care provider, you may need three doses over 6 months. Measles, mumps, and rubella (MMR) vaccine  You may need at least one dose of MMR. You may also need a second dose. Meningococcal conjugate (MenACWY) vaccine  One dose is recommended if you are age 19-21 years and a first-year college student living in a residence hall, or if you have one of several medical conditions. You may also need additional booster doses. Pneumococcal conjugate (PCV13) vaccine  You may need  this if you have certain conditions and were not previously vaccinated. Pneumococcal polysaccharide (PPSV23) vaccine  You may need one or two doses if you smoke cigarettes or if you have certain conditions. Hepatitis A vaccine  You may need this if you have certain conditions or if you travel or work in places where you may be exposed to hepatitis A. Hepatitis B vaccine  You may need this if you have certain conditions or if you travel or work in places where you may be exposed to hepatitis B. Haemophilus influenzae type b (Hib) vaccine  You may need this if you have certain conditions. You may receive vaccines as individual doses or as more than one vaccine together in one shot (combination vaccines). Talk with your health care provider about the risks and benefits of combination vaccines. What tests do I need?  Blood tests  Lipid and cholesterol levels. These may be checked every 5 years starting at age 20.  Hepatitis C test.  Hepatitis B test. Screening  Diabetes screening. This is done by checking your blood sugar (glucose) after you have not eaten for a while (fasting).  Sexually transmitted disease (STD) testing.  BRCA-related cancer screening. This may be done if you have a family history of breast, ovarian, tubal, or peritoneal cancers.  Pelvic exam and Pap test. This may be done every 3 years starting at age 21. Starting at age 30, this may be done every 5 years if you have a Pap test in combination with an HPV test. Talk with your health care provider about your test results, treatment options, and if necessary, the need for more tests.   Follow these instructions at home: Eating and drinking   Eat a diet that includes fresh fruits and vegetables, whole grains, lean protein, and low-fat dairy.  Take vitamin and mineral supplements as recommended by your health care provider.  Do not drink alcohol if: ? Your health care provider tells you not to drink. ? You are  pregnant, may be pregnant, or are planning to become pregnant.  If you drink alcohol: ? Limit how much you have to 0-1 drink a day. ? Be aware of how much alcohol is in your drink. In the [place] one drink equals one 12 oz bottle of beer (355 mL), one 5 oz glass of wine (148 mL), or one 1 oz glass of hard liquor (44 mL). Lifestyle  Take daily care of your teeth and gums.  Stay active. Exercise for at least 30 minutes on 5 or more days each week.  Do not use any products that contain nicotine or tobacco, such as cigarettes, e-cigarettes, and chewing tobacco. If you need help quitting, ask your health care provider.  If you are sexually active, practice safe sex. Use a condom or other form of birth control (contraception) in order to prevent pregnancy and STIs (sexually transmitted infections). If you plan to become pregnant, see your health care provider for a preconception visit. What's next?  Visit your health care provider once a year for a well check visit.  Ask your health care provider how often you should have your eyes and teeth checked.  Stay up to date on all vaccines. This information is not intended to replace advice given to you by your health care provider. Make sure you discuss any questions you have with your health care provider. Document Revised: 12/19/2017 Document Reviewed: 12/19/2017 Elsevier Patient Education  2020 [employer].

## 2020-02-26 NOTE — Progress Notes (Signed)
Female Physical   Impression and Recommendations:    1. Healthcare maintenance   2. Screening for chlamydial disease   3. Morbid obesity ([place])   4. Bipolar disorder, mixed ([place])   5. Depression, recurrent ([place])   6. Hyperlipidemia, unspecified hyperlipidemia type   7. Vitamin D deficiency   8. Need for HPV vaccination   9. Need for influenza vaccination   10. Screening for HIV (human immunodeficiency virus)   11. Need for hepatitis C screening test      1) Anticipatory Guidance: Discussed skin CA prevention and sunscreen when outside along with skin surveillance; eating a balanced and modest diet; physical activity at least 25 minutes per day or minimum of 150 min/ week moderate to intense activity.  2) Immunizations / Screenings / Labs:   All immunizations are up-to-date per recommendations or will be updated today if pt allows.    - Patient understands with dental and vision screens they will schedule independently.  - Will obtain CBC, CMP, HgA1c, Lipid panel, vitamin D and TSH when fasting. - UTD on pap smear - Agreeable to influenza vaccine and second HPV vaccine  3) Weight:  Discussed goal to improve diet habits to improve overall feelings of well being and objective health data. Improve nutrient density of diet through increasing intake of fruits and vegetables and decreasing saturated fats, white flour products and refined sugars. -Reports has tried multiple weight loss treatments including Phentermine, diet changes and exercise with minimal success. Is considering bariatric surgery.  -Encouraged to continue with weight loss efforts.  4) Healthcare Maintenance: -Recommend to follow-up with psychiatry to restart treatment for mood.  -Follow a Mediterranean diet. Stay well-hydrated. -Schedule lab visit for fasting blood work. -Continue tobacco cessation. -Notify the office if you need Dermatology referral.  -Follow-up in 1 year for CPE and FBW or sooner if  needed.   No orders of the defined types were placed in this encounter.   Orders Placed This Encounter  Procedures  . GC/Chlamydia Probe Amp  . HPV 9-valent vaccine,Recombinat  . Flu Vaccine QUAD 36+ mos IM  . CBC with Differential/Platelet  . Comprehensive metabolic panel  . Hemoglobin A1c  . Lipid panel  . TSH  . Hepatitis C antibody  . HIV Antibody (routine testing w rflx)  . VITAMIN D 25 Hydroxy (Vit-D Deficiency, Fractures)     Return in about 1 year (around 02/25/2021) for CPE.      Gross side effects, risk and benefits, and alternatives of medications discussed with patient.  Patient is aware that all medications have potential side effects and we are unable to predict every side effect or drug-drug interaction that may occur.  Expresses verbal understanding and consents to current therapy plan and treatment regimen.  F-up preventative CPE in 1 year- this is in addition to any chronic care visits.    Please see orders placed and AVS handed out to patient at the end of our visit for further patient instructions/ counseling done pertaining to today's office visit.  Note:  This note was prepared with assistance of Dragon voice recognition software. Occasional wrong-word or sound-a-like substitutions may have occurred due to the inherent limitations of voice recognition software.    Subjective:     CPE HPI: Anna [name] is a 24 y.o. female who presents to [facility] today for a yearly health maintenance exam.   Health Maintenance Summary  - Reviewed and updated, unless pt declines services.  Tobacco History Reviewed:  Y, former smoker with 0.75 pack year history Alcohol and/or drug use:    No concerns; no current use Dental Home: Yes Eye exams: Yes Dermatology home: Plans to get established with dermatologist father sees.  Female Health:  PAP Smear - last known results: 01/14/18- normal STD concerns:   none Menses regular:  Y Lumps or breast concerns: none Breast Cancer Family History: Y, maternal grandmother     Immunization History  Administered Date(s) Administered  . DTaP 02/17/1996, 04/27/1996, 06/22/1996, 06/24/1997  . HPV 9-valent 01/15/2019, 02/26/2020  . Hepatitis A 10/13/2007  . Hepatitis B 12/30/1995, 09/17/1996, 12/23/1996  . HiB (PRP-OMP) 02/17/1996, 04/27/1996, 06/22/1996, 03/24/1997  . IPV 02/17/1996, 04/27/1996, 06/24/1997  . Influenza,inj,Quad PF,6+ Mos 01/15/2019, 02/26/2020  . MMR 03/24/1997, 10/02/2000  . PFIZER SARS-COV-2 Vaccination 08/06/2019, 08/31/2019  . PPD Test 01/19/2020  . Td 10/13/2007  . Tdap 08/21/2017  . Varicella 12/23/1996     Health Maintenance  Topic Date Due  . Hepatitis C Screening  Never done  . PAP-Cervical Cytology Screening  01/14/2021  . PAP SMEAR-Modifier  01/14/2021  . TETANUS/TDAP  08/22/2027  . INFLUENZA VACCINE  Completed  . COVID-19 Vaccine  Completed  . HIV Screening  Completed     Wt Readings from Last 3 Encounters:  02/26/20 (!) 310 lb 6.4 oz (140.8 kg)  01/19/20 (!) 316 lb (143.3 kg)  02/04/19 (!) 312 lb 12.8 oz (141.9 kg)   BP Readings from Last 3 Encounters:  02/26/20 104/67  01/19/20 (!) 141/78  02/04/19 114/66   Pulse Readings from Last 3 Encounters:  02/26/20 74  01/19/20 84  02/04/19 82     Past Medical History:  Diagnosis Date  . Anxiety   . Borderline personality disorder ([place])   . Depression   . Depression    Phreesia 02/24/2020  . Enlarged heart   . Pneumonia   . PTSD (post-traumatic stress disorder)       History reviewed. No pertinent surgical history.    Family History  Problem Relation Age of Onset  . Clotting disorder Mother   . Cancer Father   . COPD Father        skin  . Depression Father   . Healthy Sister   . Healthy Brother   . Cancer Maternal Grandmother        breast  . Colon cancer Paternal Grandmother       Social History   Substance and Sexual Activity  Drug Use Not  Currently   Comment: 2017 Marijuana  ,   Social History   Substance and Sexual Activity  Alcohol Use Yes   Comment: occassionally  ,   Social History   Tobacco Use  Smoking Status Former Smoker  . Packs/day: 0.25  . Years: 3.00  . Pack years: 0.75  . Types: Cigarettes  . Quit date: 10/21/2017  . Years since quitting: 2.3  Smokeless Tobacco Never Used  ,   Social History   Substance and Sexual Activity  Sexual Activity Yes  . Partners: Male  . Birth control/protection: Pill, Condom    No current outpatient medications on file prior to visit.   No current facility-administered medications on file prior to visit.    Allergies: Patient has no known allergies.  Review of Systems: General:   Denies fever, chills, unexplained weight loss.  Optho/Auditory:   Denies visual changes, blurred vision/LOV Respiratory:   Denies SOB, DOE more than baseline levels.   Cardiovascular:   Denies chest  pain, palpitations, new onset peripheral edema  Gastrointestinal:   Denies nausea, vomiting, diarrhea.  Genitourinary: Denies dysuria, freq/ urgency, flank pain or discharge from genitals.  Endocrine:     Denies hot or cold intolerance, polyuria, polydipsia. Musculoskeletal:   Denies unexplained myalgias, joint swelling, unexplained arthralgias, gait problems.  Skin:  Denies rash, suspicious lesions Neurological:     Denies dizziness, unexplained weakness, numbness  Psychiatric/Behavioral:   Denies hallucinations, delusions     Objective:    Blood pressure 104/67, pulse 74, temperature 98.2 F (36.8 C), temperature source Oral, height 5' 3.5" (1.613 m), weight (!) 310 lb 6.4 oz (140.8 kg), SpO2 98 %. Body mass index is 54.12 kg/m. General Appearance:    Alert, cooperative, no distress, appears stated age  Head:    Normocephalic, without obvious abnormality, atraumatic  Eyes:    PERRL, conjunctiva/corneas clear, EOM's intact, both eyes  Ears:    Normal TM's and external ear  canals, both ears  Nose:   Nares normal, septum midline, mucosa normal, no drainage    or sinus tenderness  Throat:   Lips w/o lesion, mucosa moist, and tongue normal; teeth and   gums normal  Neck:   Supple, symmetrical, trachea midline, no adenopathy;    thyroid:  no enlargement/tenderness/nodules  Back:     Symmetric, no curvature, ROM normal, no CVA tenderness  Lungs:     Clear to auscultation bilaterally, respirations unlabored, no    Wh/ R/ R  Chest Wall:    No tenderness or gross deformity; normal excursion   Heart:    Regular rate and rhythm, S1 and S2 normal, no murmur, rub   or gallop  Breast Exam:    No tenderness, masses, or nipple abnormality b/l; no d/c  Abdomen:     Soft, non-tender, bowel sounds active all four quadrants, No   G/R/R, no masses, no organomegaly  Genitalia:    Deferred. UTD on pap  Rectal:    Deferred. No concerns/sxs.  Extremities:   Extremities normal, atraumatic, no cyanosis or gross edema  Pulses:   2+ and symmetric all extremities  Skin:   Warm, dry, Skin color, texture, turgor normal, no obvious rashes or lesions Psych: No HI/SI currently, judgement and insight good, depressed mood. Normal behavior.  Neurologic:   CNII-XII grossly intact, normal strength, sensation and reflexes throughout

## 2020-02-29 LAB — GC/CHLAMYDIA PROBE AMP
Chlamydia trachomatis, NAA: NEGATIVE
Neisseria Gonorrhoeae by PCR: NEGATIVE

## 2020-03-01 ENCOUNTER — Other Ambulatory Visit: Payer: BC Managed Care – PPO

## 2020-03-01 ENCOUNTER — Other Ambulatory Visit: Payer: Self-pay

## 2020-03-01 DIAGNOSIS — Z114 Encounter for screening for human immunodeficiency virus [HIV]: Secondary | ICD-10-CM

## 2020-03-01 DIAGNOSIS — E559 Vitamin D deficiency, unspecified: Secondary | ICD-10-CM

## 2020-03-01 DIAGNOSIS — E785 Hyperlipidemia, unspecified: Secondary | ICD-10-CM

## 2020-03-01 DIAGNOSIS — Z0000 Encounter for general adult medical examination without abnormal findings: Secondary | ICD-10-CM

## 2020-03-01 DIAGNOSIS — Z1159 Encounter for screening for other viral diseases: Secondary | ICD-10-CM

## 2020-03-02 LAB — COMPREHENSIVE METABOLIC PANEL
ALT: 14 IU/L (ref 0–32)
AST: 13 IU/L (ref 0–40)
Albumin/Globulin Ratio: 1.6 (ref 1.2–2.2)
Albumin: 4.1 g/dL (ref 3.9–5.0)
Alkaline Phosphatase: 98 IU/L (ref 44–121)
BUN/Creatinine Ratio: 13 (ref 9–23)
BUN: 9 mg/dL (ref 6–20)
Bilirubin Total: 0.2 mg/dL (ref 0.0–1.2)
CO2: 21 mmol/L (ref 20–29)
Calcium: 9 mg/dL (ref 8.7–10.2)
Chloride: 104 mmol/L (ref 96–106)
Creatinine, Ser: 0.69 mg/dL (ref 0.57–1.00)
GFR calc Af Amer: 141 mL/min/{1.73_m2} (ref >59)
GFR calc non Af Amer: 122 mL/min/{1.73_m2} (ref >59)
Globulin, Total: 2.5 g/dL (ref 1.5–4.5)
Glucose: 84 mg/dL (ref 65–99)
Potassium: 4.6 mmol/L (ref 3.5–5.2)
Sodium: 138 mmol/L (ref 134–144)
Total Protein: 6.6 g/dL (ref 6.0–8.5)

## 2020-03-02 LAB — CBC WITH DIFFERENTIAL/PLATELET
Basophils Absolute: 0 10*3/uL (ref 0.0–0.2)
Basos: 1 %
EOS (ABSOLUTE): 0.1 10*3/uL (ref 0.0–0.4)
Eos: 2 %
Hematocrit: 37.5 % (ref 34.0–46.6)
Hemoglobin: 12.1 g/dL (ref 11.1–15.9)
Immature Grans (Abs): 0 10*3/uL (ref 0.0–0.1)
Immature Granulocytes: 0 %
Lymphocytes Absolute: 2.2 10*3/uL (ref 0.7–3.1)
Lymphs: 31 %
MCH: 25.8 pg — ABNORMAL LOW (ref 26.6–33.0)
MCHC: 32.3 g/dL (ref 31.5–35.7)
MCV: 80 fL (ref 79–97)
Monocytes Absolute: 0.6 10*3/uL (ref 0.1–0.9)
Monocytes: 8 %
Neutrophils Absolute: 4.2 10*3/uL (ref 1.4–7.0)
Neutrophils: 58 %
Platelets: 481 10*3/uL — ABNORMAL HIGH (ref 150–450)
RBC: 4.69 x10E6/uL (ref 3.77–5.28)
RDW: 14.7 % (ref 11.7–15.4)
WBC: 7.2 10*3/uL (ref 3.4–10.8)

## 2020-03-02 LAB — LIPID PANEL
Chol/HDL Ratio: 3.9 ratio (ref 0.0–4.4)
Cholesterol, Total: 166 mg/dL (ref 100–199)
HDL: 43 mg/dL (ref >39)
LDL Chol Calc (NIH): 108 mg/dL — ABNORMAL HIGH (ref 0–99)
Triglycerides: 79 mg/dL (ref 0–149)
VLDL Cholesterol Cal: 15 mg/dL (ref 5–40)

## 2020-03-02 LAB — VITAMIN D 25 HYDROXY (VIT D DEFICIENCY, FRACTURES): Vit D, 25-Hydroxy: 22.2 ng/mL — ABNORMAL LOW (ref 30.0–100.0)

## 2020-03-02 LAB — HEMOGLOBIN A1C
Est. average glucose Bld gHb Est-mCnc: 108 mg/dL
Hgb A1c MFr Bld: 5.4 % (ref 4.8–5.6)

## 2020-03-02 LAB — HIV ANTIBODY (ROUTINE TESTING W REFLEX): HIV Screen 4th Generation wRfx: NONREACTIVE

## 2020-03-02 LAB — HEPATITIS C ANTIBODY: Hep C Virus Ab: <0.1 s/co ratio (ref 0.0–0.9)

## 2020-03-02 LAB — TSH: TSH: 1.98 u[IU]/mL (ref 0.450–4.500)

## 2020-04-29 IMAGING — DX DG THORACIC SPINE 2V
2 series · 2 of 2 positions shown · non-contrast
Comparison: None.

CLINICAL DATA: Thoracic back pain

EXAM:
THORACIC SPINE 2 VIEWS

[thoracic spine ap]
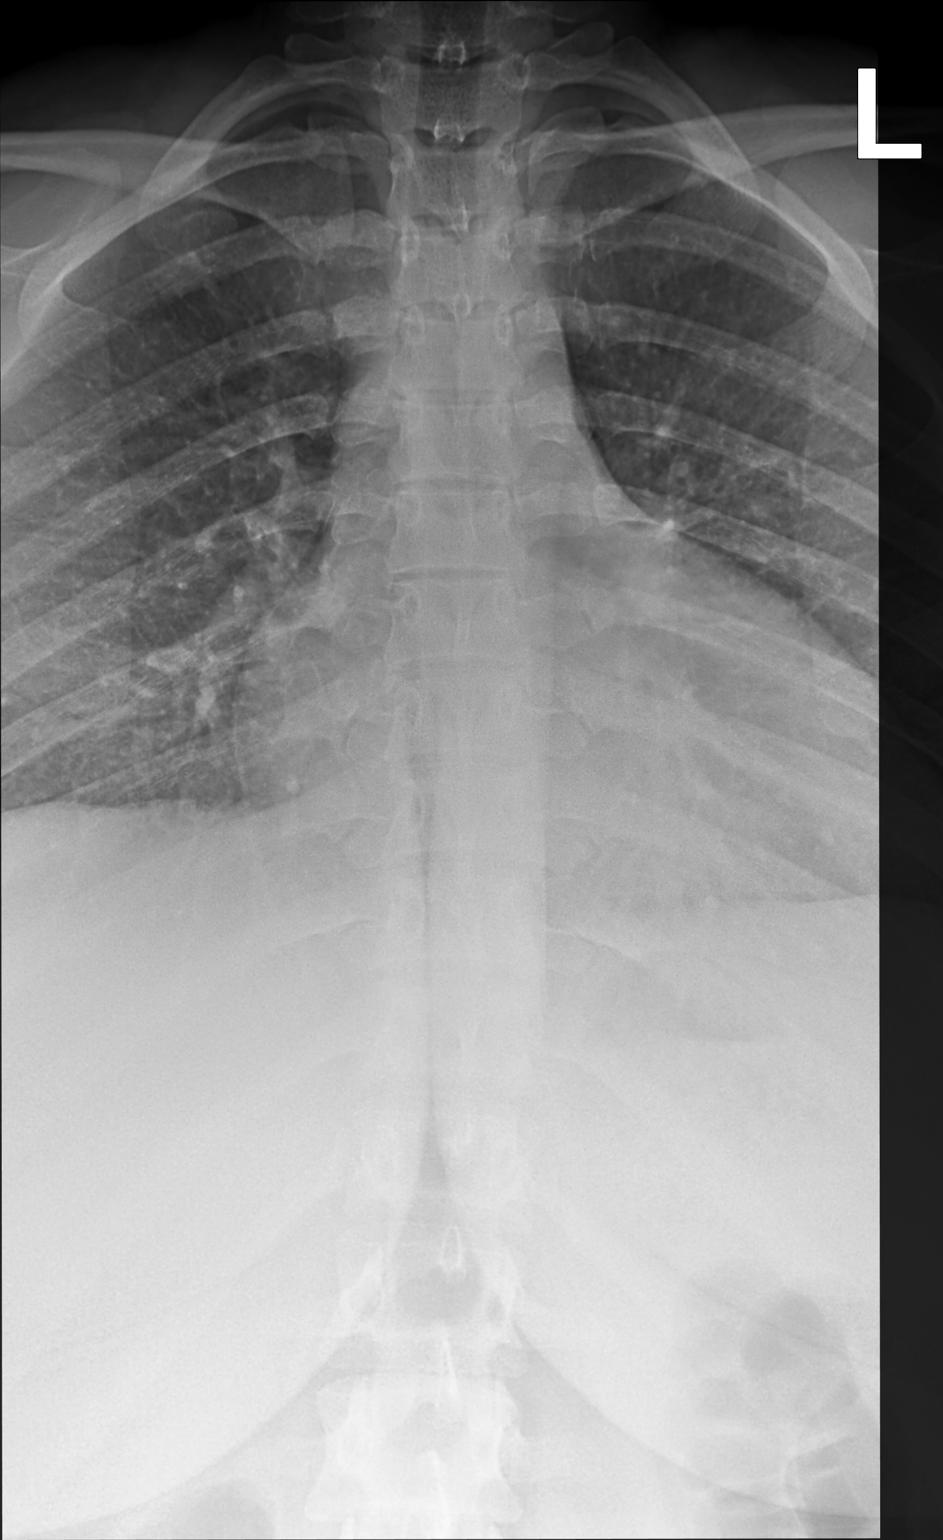

[thoracic spine lat]
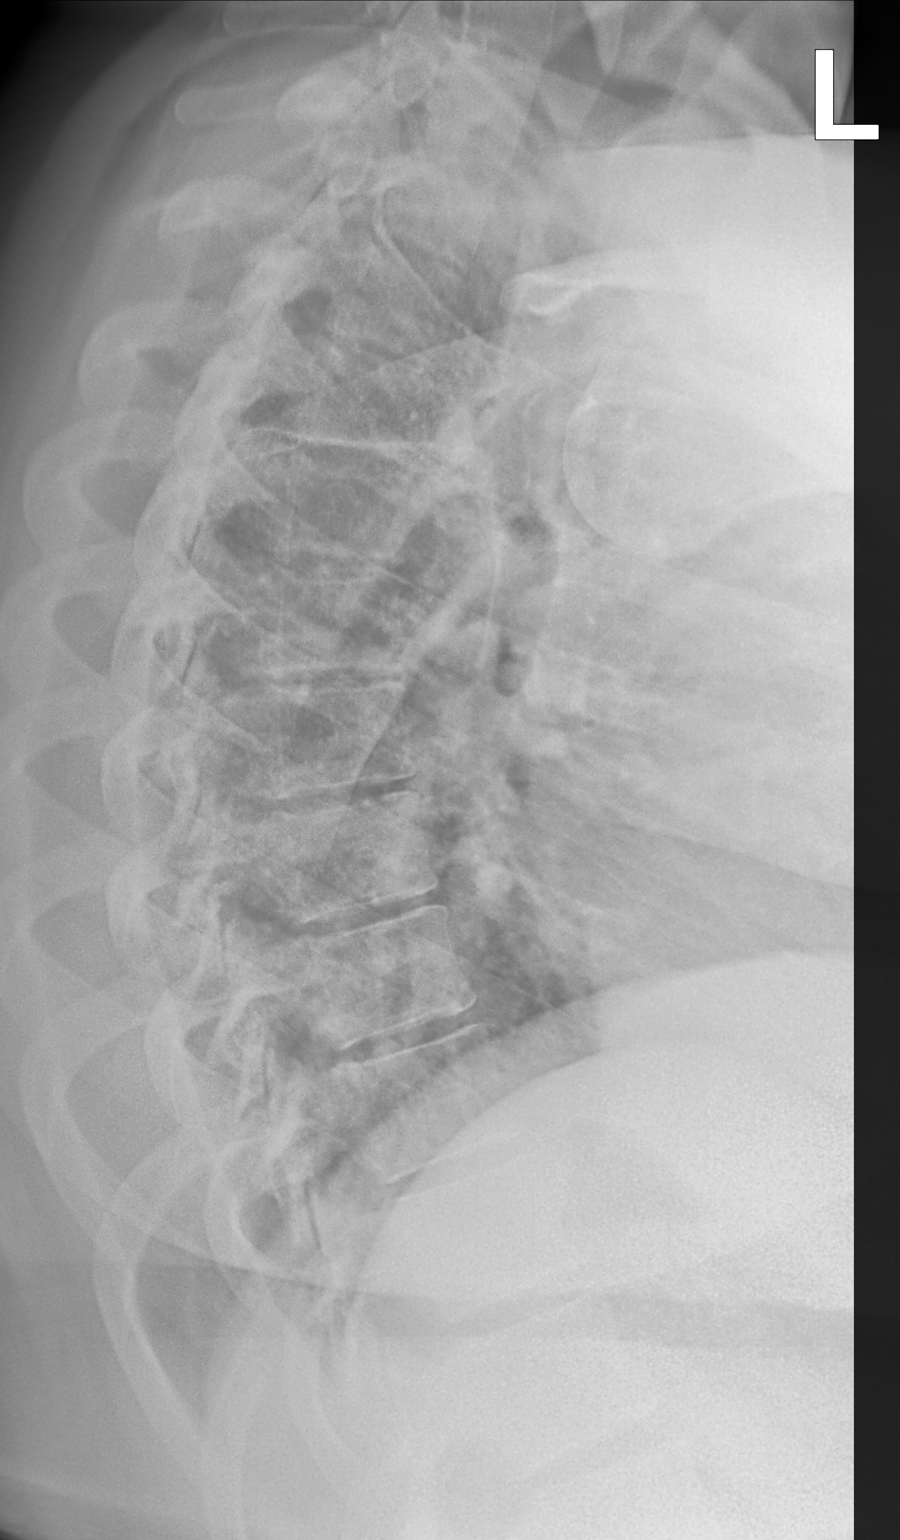

[2 of 2 positions shown; findings below may reference images not displayed]

FINDINGS: There is no evidence of thoracic spine fracture. Alignment is
normal. No other significant bone abnormalities are identified.
IMPRESSION: Negative.

## 2020-06-09 ENCOUNTER — Ambulatory Visit: Payer: BC Managed Care – PPO | Admitting: Nurse Practitioner

## 2020-06-09 ENCOUNTER — Encounter: Payer: Self-pay | Admitting: Nurse Practitioner

## 2020-06-09 ENCOUNTER — Other Ambulatory Visit: Payer: Self-pay

## 2020-06-09 VITALS — BP 128/80

## 2020-06-09 DIAGNOSIS — N644 Mastodynia: Secondary | ICD-10-CM | POA: Diagnosis not present

## 2020-06-09 DIAGNOSIS — Z975 Presence of (intrauterine) contraceptive device: Secondary | ICD-10-CM | POA: Diagnosis not present

## 2020-06-09 DIAGNOSIS — N926 Irregular menstruation, unspecified: Secondary | ICD-10-CM | POA: Diagnosis not present

## 2020-06-09 NOTE — Progress Notes (Signed)
   Acute Office Visit  Subjective:    Patient ID: Anna [name], female    DOB: August 09, 1995, 25 y.o.   MRN: [id number]   HPI 25 y.o. presents today to discuss decrease in bilateral breast size and changes in thought processes regarding gender identity. These thoughts first started a couple of months after Nexplanon was inserted in March 2020 but says more recently they been more specific to transgender thoughts. Sees psychiatry for bipolar disorder, borderline personality disorder, PTSD, and anxiety/depression. Says that psychiatric medications are being taken consistently and also sees therapy regularly. Was on OCPs in the past but was not remembering to take consistently. Not currently sexually active. Is requesting estrogen level be checked today. Has had a 50 pound weight loss since starting Phentermine in 2019. Reports light spotting today and relates it to stress.    Review of Systems  Constitutional: Negative.   Genitourinary: Negative.   Psychiatric/Behavioral: Positive for dysphoric mood. The patient is nervous/anxious.        Objective:    Physical Exam Constitutional:      Appearance: Normal appearance.     BP 128/80 (BP Location: Right Arm, Patient Position: Sitting, Cuff Size: Large)   LMP 05/30/2020  Wt Readings from Last 3 Encounters:  02/26/20 (!) 310 lb 6.4 oz (140.8 kg)  01/19/20 (!) 316 lb (143.3 kg)  02/04/19 (!) 312 lb 12.8 oz (141.9 kg)        Assessment & Plan:   Problem List Items Addressed This Visit   None   Visit Diagnoses    Breast tenderness    -  Primary   Nexplanon in place       Menstrual problem       Relevant Orders   Estradiol     Plan: We discussed that mood changes can occur with hormonal contraception and because symptoms started so soon after Nexplanon insertion we discussed options to remove Nexplanon and switch to other contraceptive method. Not currently sexually active but would like to switch to OCPs since these have been  used in the past. Switched to Nexplanon due to forgetting pills but now that other medications are begin taken daily pills would be remembered. Wants to see what estradiol level is and then will decide. Discussed that Nexplanon is progesterone-only and would not affect estrogen but she would like reassurance. Also reassured that with weight loss it is common to have loss of breast mass.     Tamela Gammon [facility], 12:14 PM 06/09/2020

## 2020-06-10 LAB — ESTRADIOL: Estradiol: 171 pg/mL

## 2020-06-20 ENCOUNTER — Ambulatory Visit (INDEPENDENT_AMBULATORY_CARE_PROVIDER_SITE_OTHER): Payer: BC Managed Care – PPO | Admitting: Nurse Practitioner

## 2020-06-20 ENCOUNTER — Encounter: Payer: Self-pay | Admitting: Nurse Practitioner

## 2020-06-20 ENCOUNTER — Ambulatory Visit: Payer: BC Managed Care – PPO | Admitting: Nurse Practitioner

## 2020-06-20 ENCOUNTER — Other Ambulatory Visit: Payer: Self-pay

## 2020-06-20 VITALS — BP 118/70 | HR 70 | Resp 16 | Wt 293.0 lb

## 2020-06-20 DIAGNOSIS — Z3046 Encounter for surveillance of implantable subdermal contraceptive: Secondary | ICD-10-CM

## 2020-06-20 DIAGNOSIS — Z3009 Encounter for other general counseling and advice on contraception: Secondary | ICD-10-CM

## 2020-06-20 DIAGNOSIS — Z30011 Encounter for initial prescription of contraceptive pills: Secondary | ICD-10-CM

## 2020-06-20 MED ORDER — LEVONORGESTREL-ETHINYL ESTRAD 0.1-20 MG-MCG PO TABS: 1.0000 | ORAL_TABLET | Freq: Every day | ORAL | 3 refills | Status: AC

## 2020-06-20 NOTE — Patient Instructions (Signed)
Nexplanon Instructions After Removal   Keep bandage clean and dry for 24 hours   May use ice/Tylenol/Ibuprofen for soreness or pain   If you develop fever, drainage or increased warmth from incision site-contact office immediately [phone]   Leave the steri-strips on for 3-5 days or until they fall off naturally     Oral Contraception Information Oral contraceptive pills (OCPs) are medicines taken by mouth to prevent pregnancy. They work by:  Preventing the ovaries from releasing eggs.  Thickening mucus in the lower part of the uterus (cervix). This prevents sperm from entering the uterus.  Thinning the lining of the uterus (endometrium). This prevents a fertilized egg from attaching to the endometrium. OCPs are highly effective when taken exactly as prescribed. However, OCPs do not prevent STIs (sexually transmitted infections). Using condoms while on an OCP can help prevent STIs. What happens before starting OCPs? Before you start taking OCPs:  You may have a physical exam, blood test, and Pap test.  Your health care provider will make sure you are a good candidate for oral contraception. OCPs are not a good option for certain women, such as: ? Women who smoke and are older than age 32. ? Women who have or have had certain conditions, such as:  A history of high blood pressure.  Deep vein thrombosis.  Pulmonary embolism.  Stroke.  Cardiovascular disease.  Peripheral vascular disease. Ask your health care provider about the possible side effects of the OCP you may be prescribed. Be aware that it can take 2-3 months for your body to adjust to changes in hormone levels. Types of oral contraception Birth control pills contain the hormones estrogen and progestin (synthetic progesterone) or progestin only. The combination pill This type of pill contains estrogen and progestin hormones.  Conventional contraception pills come in packs of 21 or 28 pills. ? Some packs  with 28-day pills contain estrogen and progestin for the first 21-24 days. Hormone-free tablets, called placebos, are taken for the final 4-7 days. You should have menstrual bleeding during the time you take the placebos. ? In packs with 21 tablets, you take no pills for 7 days. Menstrual bleeding occurs during these days. (Some people prefer taking a pill for 28 days to help establish a routine).  Extended-interval contraception pills come in packs of 91 pills. The first 84 tablets have both estrogen and progestin. The last 7 pills are placebos. Menstrual bleeding occurs during the placebo days. With this schedule, menstrual bleeding happens once every 3 months.  Continuous contraception pills come in packs of 28 pills. All pills in the pack contain estrogen and progestin. With this schedule, regular menstrual bleeding does not happen, but there may be spotting or irregular bleeding. Progestin-only pills This type of pill is often called the mini-pill and contains the progestin hormone only. It comes in packs of 28 pills. In some packs, the last 4 pills are placebos. The pill must be taken at the same time every day. This is very important to prevent pregnancy. Menstrual bleeding may not be regular or predictable.   What are the advantages? Oral contraception provides reliable and continuous contraception if taken as directed. It may treat or decrease symptoms of:  Menstrual period cramps.  Irregular menstrual cycle or bleeding.  Heavy menstrual flow.  Abnormal uterine bleeding.  Acne, depending on the type of pill.  Polycystic ovarian syndrome (POS).  Endometriosis.  Iron deficiency anemia.  Premenstrual symptoms, including severe irritability, depression, or anxiety. It also may:  Reduce the risk of endometrial and ovarian cancer.  Be used as emergency contraception.  Prevent ectopic pregnancies and infections of the fallopian tubes. What can make OCPs less effective? OCPs may  be less effective if:  You forget to take the pill every day. For progestin-only pills, it is especially important to take the pill at the same time each day. Even taking it 3 hours late can increase the risk of pregnancy.  You have a stomach or intestinal disease that reduces your body's ability to absorb the pill.  You take OCPs with other medicines that make OCPs less effective, such as antibiotics, certain HIV medicines, and some seizure medicines.  You take expired OCPs.  You forget to restart the pill after 7 days of not taking it. This refers to the packs of 21 pills. What are the side effects and risks? OCPs can sometimes cause side effects, such as:  Headache.  Depression.  Trouble sleeping.  Nausea and vomiting.  Breast tenderness.  Irregular bleeding or spotting during the first several months.  Bloating or fluid retention.  Increase in blood pressure. Combination pills may slightly increase the risk of:  Blood clots.  Heart attack.  Stroke. Follow these instructions at home: Follow instructions from your health care provider about how to start taking your first cycle of OCPs. Depending on when you start the pill, you may need to use a backup form of birth control, such as condoms, during the first week. Make sure you know what steps to take if you forget to take the pill. Summary  Oral contraceptive pills (OCPs) are medicines taken by mouth to prevent pregnancy. They are highly effective when taken exactly as prescribed.  OCPs contain a combination of the hormones estrogen and progestin (synthetic progesterone) or progestin only.  Before you start taking the pill, you may have a physical exam, blood test, and Pap test. Your health care provider will make sure you are a good candidate for oral contraception.  The combination pill may come in a 21-day pack, a 28-day pack, or a 91-day pack. Progestin-only pills come in packs of 28 pills.  OCPs can sometimes  cause side effects, such as headache, nausea, breast tenderness, or irregular bleeding. This information is not intended to replace advice given to you by your health care provider. Make sure you discuss any questions you have with your health care provider. Document Revised: 01/08/2020 Document Reviewed: 12/17/2019 Elsevier Patient Education  [place].

## 2020-06-20 NOTE — Progress Notes (Signed)
Risks of nexplanon removal was reviewed with the patient and a consent was signed.  The patient was placed in the supine position with her left  arm bent at the elbow. The area was cleansed with betadine and injected with 1% lidocaine. A #11 blade was used to incise over the distal end of the nexplanon implant. The implant was grasped with a clamp and removed.  The patients arm was cleansed of betadine and a steri strip was placed over the incision. A gauze was wrapped around her arm.  She tolerated the procedure well. Instructions for care were discussed.   Then discussed if patient would like a different birth control method. Pt decided she would like to take the pill. In the past she used a pill that she sent away for in the mail. Prior to nexplanon she was on Sprintec. She never had any trouble with pills except forgetting.  Her mother had a history of a blood clot when she was pregnant and in her 34's. She does not know if she has a clotting disorder.  Pt is currently working on weight loss, has lost >20 lbs this year.  Assessment/Plan: Nexplanon removal  OCP (oral contraceptive pills) initiation - Plan: levonorgestrel-ethinyl estradiol (ALESSE) 0.1-20 MG-MCG tablet  General counseling on prescription of oral contraceptives  Counseling/education, documentation and chart review 25 min time (in addition to Nexplanon removal) F/U 3 months for annual exam to discuss oral contraceptive use

## 2020-06-22 ENCOUNTER — Telehealth: Payer: Self-pay | Admitting: Physician Assistant

## 2020-06-22 NOTE — Telephone Encounter (Signed)
Referral placed per [facility]. AS, CMA

## 2020-08-01 ENCOUNTER — Encounter: Payer: Self-pay | Admitting: Physician Assistant

## 2020-08-01 ENCOUNTER — Ambulatory Visit (INDEPENDENT_AMBULATORY_CARE_PROVIDER_SITE_OTHER): Payer: BC Managed Care – PPO | Admitting: Physician Assistant

## 2020-08-01 ENCOUNTER — Other Ambulatory Visit: Payer: Self-pay

## 2020-08-01 VITALS — BP 99/64 | HR 88 | Temp 98.8°F | Ht 63.5 in | Wt 298.6 lb

## 2020-08-01 DIAGNOSIS — Z0289 Encounter for other administrative examinations: Secondary | ICD-10-CM

## 2020-08-01 DIAGNOSIS — Z118 Encounter for screening for other infectious and parasitic diseases: Secondary | ICD-10-CM

## 2020-08-01 DIAGNOSIS — Z111 Encounter for screening for respiratory tuberculosis: Secondary | ICD-10-CM

## 2020-08-01 DIAGNOSIS — Z113 Encounter for screening for infections with a predominantly sexual mode of transmission: Secondary | ICD-10-CM | POA: Diagnosis not present

## 2020-08-01 NOTE — Progress Notes (Signed)
Established Patient Office Visit  Subjective:  Patient ID: Anna [name], female    DOB: Nov 22, 1995  Age: 25 y.o. MRN: [id number]  CC:  Chief Complaint  Patient presents with  . 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 Patient has no concerns today. Denies chest pain, palpitations, dyspnea, dizziness, vision changes or hearing difficulty. Patient does not recall if obtained fourth dose of Polio and will try to find out. Other immunizations are up to date.   Past Medical History:  Diagnosis Date  . Anxiety   . Borderline personality disorder ([place])   . Depression   . Depression    Phreesia 02/24/2020  . Enlarged heart   . Pneumonia   . PTSD (post-traumatic stress disorder)     Past Surgical History:  Procedure Laterality Date  . WISDOM TOOTH EXTRACTION      Family History  Problem Relation Age of Onset  . Clotting disorder Mother   . Cancer Father   . COPD Father        skin  . Depression Father   . Healthy Sister   . Healthy Brother   . Cancer Maternal Grandmother        breast  . Colon cancer Paternal Grandmother     Social History   Socioeconomic History  . Marital status: Single    Spouse name: Not on file  . Number of children: 0  . Years of education: Not on file  . Highest education level: Not on file  Occupational History  . Not on file  Tobacco Use  . Smoking status: Former Smoker    Packs/day: 0.25    Years: 3.00    Pack years: 0.75    Types: Cigarettes    Quit date: 10/21/2017    Years since quitting: 2.7  . Smokeless tobacco: Never Used  Vaping Use  . Vaping Use: Every day  Substance and Sexual Activity  . Alcohol use: Not Currently  . Drug use: Not Currently    Comment: 2017 Marijuana  . Sexual activity: Not Currently    Partners: Male    Birth control/protection: Implant  Other Topics Concern  . Not on file  Social History Narrative  . Not on file   Social  Determinants of Health   Financial Resource Strain: Not on file  Food Insecurity: Not on file  Transportation Needs: Not on file  Physical Activity: Not on file  Stress: Not on file  Social Connections: Not on file  Intimate Partner Violence: Not on file    Outpatient Medications Prior to Visit  Medication Sig Dispense Refill  . ARIPiprazole (ABILIFY) 2 MG tablet Take 2 mg by mouth at bedtime.    Marland Kitchen levonorgestrel-ethinyl estradiol (ALESSE) 0.1-20 MG-MCG tablet Take 1 tablet by mouth daily. 28 tablet 3  . phentermine (ADIPEX-P) 37.5 MG tablet Take 37.5 mg by mouth every morning.    . venlafaxine XR (EFFEXOR-XR) 150 MG 24 hr capsule TAKE 1 CAPSULE BY MOUTH IN THE MORNING     No facility-administered medications prior to visit.    No Known Allergies  ROS Review of Systems A fourteen system review of systems was performed and found to be positive as per HPI.   Objective:     Physical Exam:  General:  Well Developed, well nourished, in no acute distress, non-toxic appearing  Neuro:  Alert and oriented,  extra-ocular muscles intact  HEENT:  Normocephalic, atraumatic, neck supple  Skin:  no gross rash, warm, pink. Cardiac:  RRR, S1 S2 wnl's, no murmur  Respiratory:  ECTA B/L w/o wheezing, crackles or rales, Not using accessory muscles, speaking in full sentences- unlabored. Vascular:  Ext warm, no cyanosis apprec.; cap RF less 2 sec. Psych:  No HI/SI, judgement and insight good, Euthymic mood. Full Affect.    BP 99/64   Pulse 88   Temp 98.8 F (37.1 C)   Ht 5' 3.5" (1.613 m)   Wt 298 lb 9.6 oz (135.4 kg)   LMP  (LMP Unknown)   SpO2 99%   BMI 52.06 kg/m  Wt Readings from Last 3 Encounters:  08/01/20 298 lb 9.6 oz (135.4 kg)  06/20/20 293 lb (132.9 kg)  02/26/20 (!) 310 lb 6.4 oz (140.8 kg)     Health Maintenance Due  Topic Date Due  . COVID-19 Vaccine (3 - Booster for Pfizer series) 03/02/2020  . HPV VACCINES (3 - 3-dose series) 06/25/2020       Topic Date Due   . HPV VACCINES (3 - 3-dose series) 06/25/2020    Lab Results  Component Value Date   TSH 1.980 03/01/2020   Lab Results  Component Value Date   WBC 7.2 03/01/2020   HGB 12.1 03/01/2020   HCT 37.5 03/01/2020   MCV 80 03/01/2020   PLT 481 (H) 03/01/2020   Lab Results  Component Value Date   NA 138 03/01/2020   K 4.6 03/01/2020   CO2 21 03/01/2020   GLUCOSE 84 03/01/2020   BUN 9 03/01/2020   CREATININE 0.69 03/01/2020   BILITOT 0.2 03/01/2020   ALKPHOS 98 03/01/2020   AST 13 03/01/2020   ALT 14 03/01/2020   PROT 6.6 03/01/2020   ALBUMIN 4.1 03/01/2020   CALCIUM 9.0 03/01/2020   Lab Results  Component Value Date   CHOL 166 03/01/2020   Lab Results  Component Value Date   HDL 43 03/01/2020   Lab Results  Component Value Date   LDLCALC 108 (H) 03/01/2020   Lab Results  Component Value Date   TRIG 79 03/01/2020   Lab Results  Component Value Date   CHOLHDL 3.9 03/01/2020   Lab Results  Component Value Date   HGBA1C 5.4 03/01/2020      Assessment & Plan:   Problem List Items Addressed This Visit   None   Visit Diagnoses    Encounter for physical examination related to employment    -  Primary   Screening for gonorrhea       Relevant Orders   GC/Chlamydia Probe Amp   Screening for chlamydial disease       Relevant Orders   GC/Chlamydia Probe Amp   Visit for TB skin test       Relevant Orders   TB Skin Test (Completed)     Encounter for physical examination related to employment: -Will complete employment form 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 -Immunizations UTD with exception of Polio series. Patient will inquire vaccine record from school.    No orders of the defined types were placed in this encounter.   Follow-up: Return in about 7 months (around 03/03/2021) for CPE.    Lorrene Reid, PA-C

## 2020-08-01 NOTE — Patient Instructions (Signed)

## 2020-08-02 LAB — GC/CHLAMYDIA PROBE AMP
Chlamydia trachomatis, NAA: NEGATIVE
Neisseria Gonorrhoeae by PCR: NEGATIVE

## 2020-08-04 ENCOUNTER — Other Ambulatory Visit: Payer: Self-pay

## 2020-08-04 ENCOUNTER — Other Ambulatory Visit: Payer: BC Managed Care – PPO

## 2020-08-04 ENCOUNTER — Other Ambulatory Visit: Payer: Self-pay | Admitting: Physician Assistant

## 2020-08-04 LAB — TB SKIN TEST
Induration: 0 mm
TB Skin Test: NEGATIVE

## 2020-08-15 LAB — POLIOVIRUS (TYPES 1,3) AB
Polio Virus Antibody Type 1: 1:40 {titer}
Polio Virus Antibody Type 3: 1:80 {titer}

## 2020-09-29 ENCOUNTER — Ambulatory Visit: Payer: BC Managed Care – PPO | Admitting: Nurse Practitioner

## 2020-10-17 ENCOUNTER — Telehealth: Payer: Self-pay | Admitting: Physician Assistant

## 2020-10-17 NOTE — Telephone Encounter (Signed)
error 

## 2021-03-03 ENCOUNTER — Encounter: Payer: BC Managed Care – PPO | Admitting: Physician Assistant

## 2021-03-15 ENCOUNTER — Encounter: Payer: BC Managed Care – PPO | Admitting: Physician Assistant

## 2021-04-05 ENCOUNTER — Telehealth: Payer: Self-pay | Admitting: Physician Assistant

## 2021-04-05 NOTE — Telephone Encounter (Signed)
Patient is aware we do not do hormone therapy for trans. AS, CMA

## 2021-04-05 NOTE — Telephone Encounter (Signed)
Patient has an appointment to talk about a mole removal on 12/27 but also wants to talk about hormone replacement can this be done at same time? [phone]

## 2021-04-18 ENCOUNTER — Encounter: Payer: Self-pay | Admitting: Physician Assistant

## 2021-04-18 ENCOUNTER — Ambulatory Visit: Payer: BC Managed Care – PPO | Admitting: Physician Assistant

## 2021-04-18 ENCOUNTER — Other Ambulatory Visit: Payer: Self-pay

## 2021-04-18 VITALS — BP 112/68 | HR 84 | Temp 98.2°F | Ht 64.0 in | Wt 311.0 lb

## 2021-04-18 DIAGNOSIS — D229 Melanocytic nevi, unspecified: Secondary | ICD-10-CM | POA: Diagnosis not present

## 2021-04-18 DIAGNOSIS — Z1283 Encounter for screening for malignant neoplasm of skin: Secondary | ICD-10-CM

## 2021-04-18 DIAGNOSIS — Z808 Family history of malignant neoplasm of other organs or systems: Secondary | ICD-10-CM

## 2021-04-18 NOTE — Patient Instructions (Signed)
Preventing Skin Cancer, Adult Skin cancer is the most common type of cancer. There are three main types: Squamous cell. Basal cell. Melanoma. Squamous cell and basal cell skin cancer are the most common types. Melanoma is the most dangerous type. Most skin cancers are caused by skin damage from exposure to ultraviolet (UV) light. UV light comes from the sun and from artificial tanning beds. Suntans and sunburns result from exposure to UV light. Skin cancer occurs in people of all skin colors. Skin cancer occurs most often in older people, but it is usually the result of damage done earlier in life. The tans and sunburns you get at any age can lead to skin cancer in the future. To help prevent this, you can take steps to protect yourself. What actions can I take to protect myself from skin cancer? Many people like to get a tan, especially in the summer or when on vacation. However, tan or burned skin is a sign of skin damage and increases your risk for skin cancer. To lower your risk: Avoid exposure to UV light  Try to stay out of the sun between 10 a.m. and 4 p.m. whenever possible. This is when the sun is at its strongest. Seek the shade during this time. Remember that you can also be exposed to UV rays on cloudy or hazy days. Nancy Fetter exposure can be risky year-round, not just in the summer. Do not use a sunlamp, tanning bed, or tanning booth to get a tan. If you really want a tan, use an artificial tanning lotion. Avoid getting sunburned. Sunburns are more common on bright sunny days, especially when you are in areas where the sun is reflected off water or snow. Use sunscreen and protective clothing  Always use sunscreen--either a cream, lotion, or spray--when you are out in the sun. Keep sunscreen handy, such as in your gym bag or in your car, so that you will have it when you need it. Use sunscreen with a sun protection factor (SPF) of 30 or higher. Make sure your sunscreen protects you from UVA  and UVB light. It should also be water-resistant. Use enough sunscreen to cover all exposed areas of your skin. Put it on 15-30 minutes before you go out. Reapply sunscreen every 2 hours or anytime you come out of the water. When you are out in the sun, wear a broad-brimmed hat, clothing that covers your arms and legs, and wraparound sunglasses. Protect your lips by wearing a lip balm or lip stick with an SPF of at least 30. Check your skin for changes Check your skin often from head to toe to look for any changes in the size, color, or shape of any moles or freckles. Check for any new moles or moles that bleed or become itchy. See your health care provider if you notice any changes. Ask your health care provider about a total skin check. Ask if it should be part of your yearly physical or if you need to see a skin specialist (dermatologist). Take other preventive measures  Avoid exposure to harmful chemicals, such as arsenic. You can do this by: Having your home's water tested for arsenic and other chemicals. Taking protective measures to avoid exposure to chemicals at work. Do not use any products that contain nicotine or tobacco. These products include cigarettes, chewing tobacco, and vaping devices, such as e-cigarettes. If you need help quitting, ask your health care provider. Keep your immune system healthy. Take steps, such as: Staying up to  date on all vaccines, including the human papillomavirus (HPV) vaccine. Eating at least 5 servings of fruits and vegetables every day. Why are these changes important? About 1 of every 5 people will get skin cancer. The best way to reduce your risk is to avoid skin damage from UV light. If you have teenagers in your house, they should know that just five bad sunburns as a teen could double their risk of skin cancer in the future. If you have younger children, always make sure to protect their skin from the sun. These changes can help reduce your risk of  skin cancer. They will also provide other health benefits, such as: Protecting your skin from the sun can help prevent painful sunburns, sun poisoning, and other skin damage and blemishes. This is especially important if: You have pale white skin, freckles, and red hair. You burn easily. Avoiding exposure to harmful chemicals can help prevent damage to other tissues in your body, such as your lungs, and prevent other types of cancer. Avoiding smoking tobacco can reduce your risk for other types of cancer and other health problems. Eating a healthy diet is good for your overall health. What can happen if changes are not made? If you do not make these changes, you will be at higher risk for skin cancer. If you develop skin cancer, the treatments could result in lost time from work and changes in your appearance from scars. The most dangerous type of skin cancer, melanoma, can be deadly if not found early. Where to find support For more support, talk to your health care provider or dermatologist. Where to find more information Learn more about skin cancer from: The [place]: www.skincancer.org/prevention The Centers for Disease Control and Prevention: [employer]se The American Academy of Dermatology: http://jones-macias.info/ Summary Skin cancer is the most common type of cancer. Melanoma skin cancer can be deadly if not found early. Sunburns and tanning increase your risk for skin cancer. Protecting your skin from UV light is the best way to prevent skin cancer. This information is not intended to replace advice given to you by your health care provider. Make sure you discuss any questions you have with your health care provider. Document Revised: 10/04/2020 Document Reviewed: 10/04/2020 Elsevier Patient Education  [place].

## 2021-04-18 NOTE — Progress Notes (Signed)
Acute Office Visit  Subjective:    Patient ID: Anna [name], female    DOB: 06-Jun-1995, 25 y.o.   MRN: [id number]  Chief Complaint  Patient presents with   Nevus    HPI Patient is in today for concerns of dark looking moles. Does have family history of skin cancer. Patient unsure what type but thinks possibly melanoma. A mole on her neck seems to have increased in size. Tries to wear sunscreen daily but not always. No fever, chills, night sweats or unintentional weight loss. Patient is being followed by psychology and psychiatry for mood.       Past Medical History:  Diagnosis Date   Anxiety    Borderline personality disorder ([place])    Depression    Depression    Phreesia 02/24/2020   Enlarged heart    Pneumonia    PTSD (post-traumatic stress disorder)     Past Surgical History:  Procedure Laterality Date   WISDOM TOOTH EXTRACTION      Family History  Problem Relation Age of Onset   Clotting disorder Mother    Cancer Father    COPD Father        skin   Depression Father    Healthy Sister    Healthy Brother    Cancer Maternal Grandmother        breast   Colon cancer Paternal Grandmother     Social History   Socioeconomic History   Marital status: Single    Spouse name: Not on file   Number of children: 0   Years of education: Not on file   Highest education level: Not on file  Occupational History   Not on file  Tobacco Use   Smoking status: Former    Packs/day: 0.25    Years: 3.00    Pack years: 0.75    Types: Cigarettes    Quit date: 10/21/2017    Years since quitting: 3.4   Smokeless tobacco: Never  Vaping Use   Vaping Use: Every day  Substance and Sexual Activity   Alcohol use: Not Currently   Drug use: Not Currently    Comment: 2017 Marijuana   Sexual activity: Not Currently    Partners: Male    Birth control/protection: Implant  Other Topics Concern   Not on file  Social History Narrative   Not on file   Social Determinants of  Health   Financial Resource Strain: Not on file  Food Insecurity: Not on file  Transportation Needs: Not on file  Physical Activity: Not on file  Stress: Not on file  Social Connections: Not on file  Intimate Partner Violence: Not on file    Outpatient Medications Prior to Visit  Medication Sig Dispense Refill   amphetamine-dextroamphetamine (ADDERALL XR) 20 MG 24 hr capsule Take 20 mg by mouth 2 (two) times daily.     ARIPiprazole (ABILIFY) 2 MG tablet Take 2 mg by mouth at bedtime.     levonorgestrel-ethinyl estradiol (ALESSE) 0.1-20 MG-MCG tablet Take 1 tablet by mouth daily. 28 tablet 3   traZODone (DESYREL) 50 MG tablet Take 50-150 mg by mouth at bedtime as needed.     venlafaxine XR (EFFEXOR-XR) 150 MG 24 hr capsule TAKE 1 CAPSULE BY MOUTH IN THE MORNING     phentermine (ADIPEX-P) 37.5 MG tablet Take 37.5 mg by mouth every morning.     No facility-administered medications prior to visit.    No Known Allergies  Review of Systems Review of Systems:  A  fourteen system review of systems was performed and found to be positive as per HPI.    Objective:    Physical Exam General:  Pleasant and cooperative, in no acute distress .  Neuro:  Alert and oriented,  extra-ocular muscles intact  HEENT:  Normocephalic, atraumatic, neck supple  Skin:  scattered irregular dark moles and brown waxy papules  Cardiac:  RRR, S1 S2 Respiratory: Not using accessory muscles, speaking in full sentences- unlabored. Vascular:  Ext warm, no cyanosis apprec.; cap RF less 2 sec. Psych:  No HI/SI, judgement and insight good, Euthymic mood. Full Affect.  BP 112/68    Pulse 84    Temp 98.2 F (36.8 C)    Ht 5\' 4"  (1.626 m)    Wt (!) 311 lb (141.1 kg)    SpO2 98%    BMI 53.38 kg/m  Wt Readings from Last 3 Encounters:  04/18/21 (!) 311 lb (141.1 kg)  08/01/20 298 lb 9.6 oz (135.4 kg)  06/20/20 293 lb (132.9 kg)    Health Maintenance Due  Topic Date Due   COVID-19 Vaccine (3 - Booster for Pfizer  series) 10/26/2019   HPV VACCINES (3 - 3-dose series) 06/25/2020   INFLUENZA VACCINE  11/21/2020   PAP-Cervical Cytology Screening  01/14/2021   PAP SMEAR-Modifier  01/14/2021       Topic Date Due   HPV VACCINES (3 - 3-dose series) 06/25/2020     Lab Results  Component Value Date   TSH 1.980 03/01/2020   Lab Results  Component Value Date   WBC 7.2 03/01/2020   HGB 12.1 03/01/2020   HCT 37.5 03/01/2020   MCV 80 03/01/2020   PLT 481 (H) 03/01/2020   Lab Results  Component Value Date   NA 138 03/01/2020   K 4.6 03/01/2020   CO2 21 03/01/2020   GLUCOSE 84 03/01/2020   BUN 9 03/01/2020   CREATININE 0.69 03/01/2020   BILITOT 0.2 03/01/2020   ALKPHOS 98 03/01/2020   AST 13 03/01/2020   ALT 14 03/01/2020   PROT 6.6 03/01/2020   ALBUMIN 4.1 03/01/2020   CALCIUM 9.0 03/01/2020   Lab Results  Component Value Date   CHOL 166 03/01/2020   Lab Results  Component Value Date   HDL 43 03/01/2020   Lab Results  Component Value Date   LDLCALC 108 (H) 03/01/2020   Lab Results  Component Value Date   TRIG 79 03/01/2020   Lab Results  Component Value Date   CHOLHDL 3.9 03/01/2020   Lab Results  Component Value Date   HGBA1C 5.4 03/01/2020       Assessment & Plan:   Problem List Items Addressed This Visit   None Visit Diagnoses     Atypical mole    -  Primary   Relevant Orders   Ambulatory referral to Dermatology   Skin cancer screening       Relevant Orders   Ambulatory referral to Dermatology   Family history of skin cancer       Relevant Orders   Ambulatory referral to Dermatology      Will place dermatology referral for complete skin cancer screening and further evaluation of atypical moles.   No orders of the defined types were placed in this encounter.    Lorrene Reid, PA-C

## 2021-04-26 ENCOUNTER — Telehealth: Payer: Self-pay | Admitting: Physician Assistant

## 2021-04-26 NOTE — Telephone Encounter (Signed)
Referral attached to appointment

## 2021-04-26 NOTE — Telephone Encounter (Signed)
Patient is calling for a referral appointment from [facility], PA-C.  Patient is scheduled for 11/01/2021 at 11:00 with [facility], PA-C.

## 2021-07-10 ENCOUNTER — Encounter: Payer: Self-pay | Admitting: Physician Assistant

## 2021-08-14 ENCOUNTER — Encounter: Payer: Self-pay | Admitting: Physician Assistant

## 2021-10-25 ENCOUNTER — Ambulatory Visit (INDEPENDENT_AMBULATORY_CARE_PROVIDER_SITE_OTHER): Payer: BC Managed Care – PPO | Admitting: Bariatrics

## 2021-11-01 ENCOUNTER — Ambulatory Visit: Payer: BC Managed Care – PPO | Admitting: Physician Assistant

## 2021-11-08 ENCOUNTER — Ambulatory Visit (INDEPENDENT_AMBULATORY_CARE_PROVIDER_SITE_OTHER): Payer: BC Managed Care – PPO | Admitting: Bariatrics

## 2021-11-29 ENCOUNTER — Encounter (INDEPENDENT_AMBULATORY_CARE_PROVIDER_SITE_OTHER): Payer: Self-pay

## 2021-12-06 ENCOUNTER — Ambulatory Visit: Payer: Self-pay

## 2023-02-22 ENCOUNTER — Ambulatory Visit (HOSPITAL_COMMUNITY)
Admission: EM | Admit: 2023-02-22 | Discharge: 2023-02-23 | Disposition: A | Discharge status: Other Acute Inpatient Hospital | Payer: 59 | Attending: Behavioral Health | Admitting: Behavioral Health

## 2023-02-22 DIAGNOSIS — F319 Bipolar disorder, unspecified: Secondary | ICD-10-CM | POA: Insufficient documentation

## 2023-02-22 DIAGNOSIS — Z9151 Personal history of suicidal behavior: Secondary | ICD-10-CM | POA: Diagnosis not present

## 2023-02-22 DIAGNOSIS — F909 Attention-deficit hyperactivity disorder, unspecified type: Secondary | ICD-10-CM | POA: Diagnosis not present

## 2023-02-22 DIAGNOSIS — F4310 Post-traumatic stress disorder, unspecified: Secondary | ICD-10-CM | POA: Diagnosis not present

## 2023-02-22 DIAGNOSIS — F3130 Bipolar disorder, current episode depressed, mild or moderate severity, unspecified: Secondary | ICD-10-CM | POA: Diagnosis not present

## 2023-02-22 DIAGNOSIS — Z79899 Other long term (current) drug therapy: Secondary | ICD-10-CM | POA: Diagnosis not present

## 2023-02-22 DIAGNOSIS — R45851 Suicidal ideations: Secondary | ICD-10-CM | POA: Insufficient documentation

## 2023-02-22 DIAGNOSIS — F1290 Cannabis use, unspecified, uncomplicated: Secondary | ICD-10-CM | POA: Diagnosis not present

## 2023-02-22 DIAGNOSIS — F1090 Alcohol use, unspecified, uncomplicated: Secondary | ICD-10-CM | POA: Diagnosis not present

## 2023-02-22 DIAGNOSIS — F419 Anxiety disorder, unspecified: Secondary | ICD-10-CM | POA: Diagnosis not present

## 2023-02-22 DIAGNOSIS — F603 Borderline personality disorder: Secondary | ICD-10-CM | POA: Insufficient documentation

## 2023-02-22 LAB — CBC WITH DIFFERENTIAL/PLATELET
Abs Immature Granulocytes: 0.04 10*3/uL (ref 0.00–0.07)
Basophils Absolute: 0 10*3/uL (ref 0.0–0.1)
Basophils Relative: 0 %
Eosinophils Absolute: 0 10*3/uL (ref 0.0–0.5)
Eosinophils Relative: 0 %
HCT: 39.4 % (ref 36.0–46.0)
Hemoglobin: 13.1 g/dL (ref 12.0–15.0)
Immature Granulocytes: 0 %
Lymphocytes Relative: 17 %
Lymphs Abs: 1.7 10*3/uL (ref 0.7–4.0)
MCH: 28.9 pg (ref 26.0–34.0)
MCHC: 33.2 g/dL (ref 30.0–36.0)
MCV: 87 fL (ref 80.0–100.0)
Monocytes Absolute: 0.7 10*3/uL (ref 0.1–1.0)
Monocytes Relative: 7 %
Neutro Abs: 8 10*3/uL — ABNORMAL HIGH (ref 1.7–7.7)
Neutrophils Relative %: 76 %
Platelets: 407 10*3/uL — ABNORMAL HIGH (ref 150–400)
RBC: 4.53 MIL/uL (ref 3.87–5.11)
RDW: 13 % (ref 11.5–15.5)
WBC: 10.5 10*3/uL (ref 4.0–10.5)
nRBC: 0 % (ref 0.0–0.2)

## 2023-02-22 LAB — COMPREHENSIVE METABOLIC PANEL
ALT: 31 U/L (ref 0–44)
AST: 21 U/L (ref 15–41)
Albumin: 3.9 g/dL (ref 3.5–5.0)
Alkaline Phosphatase: 63 U/L (ref 38–126)
Anion gap: 9 (ref 5–15)
BUN: 6 mg/dL (ref 6–20)
CO2: 25 mmol/L (ref 22–32)
Calcium: 9.5 mg/dL (ref 8.9–10.3)
Chloride: 106 mmol/L (ref 98–111)
Creatinine, Ser: 0.83 mg/dL (ref 0.44–1.00)
GFR, Estimated: >60 mL/min (ref >60)
Glucose, Bld: 93 mg/dL (ref 70–99)
Potassium: 3.7 mmol/L (ref 3.5–5.1)
Sodium: 140 mmol/L (ref 135–145)
Total Bilirubin: 0.7 mg/dL (ref 0.3–1.2)
Total Protein: 7.3 g/dL (ref 6.5–8.1)

## 2023-02-22 LAB — POCT URINE DRUG SCREEN - MANUAL ENTRY (I-SCREEN)
POC Amphetamine UR: NOT DETECTED
POC Buprenorphine (BUP): NOT DETECTED
POC Cocaine UR: NOT DETECTED
POC Marijuana UR: POSITIVE — AB
POC Methadone UR: NOT DETECTED
POC Methamphetamine UR: NOT DETECTED
POC Morphine: NOT DETECTED
POC Oxazepam (BZO): NOT DETECTED
POC Oxycodone UR: NOT DETECTED
POC Secobarbital (BAR): NOT DETECTED

## 2023-02-22 LAB — LIPID PANEL
Cholesterol: 194 mg/dL (ref 0–200)
HDL: 55 mg/dL (ref >40)
LDL Cholesterol: 130 mg/dL — ABNORMAL HIGH (ref 0–99)
Total CHOL/HDL Ratio: 3.5 {ratio}
Triglycerides: 45 mg/dL (ref <150)
VLDL: 9 mg/dL (ref 0–40)

## 2023-02-22 LAB — HEMOGLOBIN A1C
Hgb A1c MFr Bld: 5.1 % (ref 4.8–5.6)
Mean Plasma Glucose: 99.67 mg/dL

## 2023-02-22 LAB — POCT PREGNANCY, URINE: Preg Test, Ur: NEGATIVE

## 2023-02-22 LAB — ETHANOL: Alcohol, Ethyl (B): <10 mg/dL (ref <10)

## 2023-02-22 LAB — TSH: TSH: 2.533 u[IU]/mL (ref 0.350–4.500)

## 2023-02-22 MED ORDER — ACETAMINOPHEN 325 MG PO TABS: 650.0000 mg | ORAL_TABLET | Freq: Four times a day (QID) | ORAL | Status: DC | PRN

## 2023-02-22 MED ORDER — ARIPIPRAZOLE 2 MG PO TABS
2.0000 mg | ORAL_TABLET | Freq: Every day | ORAL | Status: DC
  Administered 2023-02-22: 2 mg via ORAL
  Filled 2023-02-22: qty 1

## 2023-02-22 MED ORDER — HYDROXYZINE HCL 25 MG PO TABS
25.0000 mg | ORAL_TABLET | Freq: Once | ORAL | Status: AC
Start: 2023-02-22 — End: 2023-02-22
  Administered 2023-02-22: 25 mg via ORAL
  Filled 2023-02-22: qty 1

## 2023-02-22 MED ORDER — HYDROXYZINE HCL 25 MG PO TABS
25.0000 mg | ORAL_TABLET | Freq: Three times a day (TID) | ORAL | Status: DC | PRN
Start: 2023-02-22 — End: 2023-02-23

## 2023-02-22 MED ORDER — MAGNESIUM HYDROXIDE 400 MG/5ML PO SUSP: 30.0000 mL | Freq: Every day | ORAL | Status: DC | PRN

## 2023-02-22 MED ORDER — ALUM & MAG HYDROXIDE-SIMETH 200-200-20 MG/5ML PO SUSP: 30.0000 mL | ORAL | Status: DC | PRN

## 2023-02-22 NOTE — ED Provider Notes (Signed)
[facility] Continuous Assessment Admission H&P  Date: 02/22/23 Patient Name: Anna [name] MRN: [id number] Chief Complaint: Pt states he feels confused, paranoia, crying spells, over thinking, difficulty sleeping, decreased appetite, agitation,passive SI.  Diagnoses:  Final diagnoses:  Bipolar I disorder, most recent episode depressed (HCC)    HPI: Anna [name] is a 27 year old female patient with a past psychiatric history significant for borderline personality disorder, PTSD, bipolar disorder mixed, anxiety, MDD and PTSD who presents to the [facility] behavioral health urgent care voluntary accompanied by her father Anna [name] for a psychiatric evaluation.   Per triage note: Anna [name] (He/Him, "Anna [name]") is a 27 year old female (female) who presents to [facility] accompanied by her father. Pt reports he is diagnosed with PTSD,BPD,MDD,ADHD, Dyslexia and is transgender. Pt states he has been without his medication and therapy since June 2024. Pt states he was unable to receive services because he no longer had insurance. Pt states he feels confused, paranoia, crying spells, over thinking, difficulty sleeping, decreased appetite, agitation,passive SI. Pt states he does not have a clear plan or intent to hurt himself. Pt reports he has been using marijuana and alcohol frequently. He also reports using shrooms 4 months ago. Pt reports hearing voices currently telling him "everythings going to be fine". Pt has tangential speech, jumping from one subject to the next, crying hysterically. Pt reports NSSIB in the past by choking herself. Pt reports past suicide attempt but states she cannot recall when it was and states she has difficulty with identifying real life from her "vivid thoughts". Pt reports his last hospitalization was in 2019 for SI/HI.Pt denies HI and Visual Hallucinations currently. She reports she was previously seen by Dr.Rupinder Evelene Croon, MD [phone], and [facility] [phone]). He reports he has 2 emotional support cats, and is prescribed Dextroamphetamine / Amphetamine ER 20mg , Dextroamphetamine. 10mg , aripiprazole 2mg , and venlafaxine ER 150mg .   On evaluation, patient is sitting down in the consult room with her father present. He asked if his father could step out of the consult room to complete the assessment for privacy. He states there's things that she does not want to share in front of his father. He begins crying hysterically and start ruminating on events. He appears overwhelmed and is observed holding his head while crying during the assessment. He states that he does not want to relive answering the same questions. He states that he is having a hard time because he know that he won't be able to see his support cat if he gets admitted. He endorses passive suicidal ideations and states, "is it suicidal to give my family happiness." When asked to elaborate, he states, it would be easier to be gone in any sense." He states that he doesn't want to kill himself and wants to fight but he's tired. He reports 8 past suicide attempts with last attempt being three to one month ago by attempting to take a steak knife and pushing it up against his temporal. He reports an inpatient psychiatric hospitalization in [place], [place]., in 2019. He states that he's been questioning everything, his morals and who he is. He states that he is scare to be admitted but knows that he needs help. He has a difficult time focusing and answering questions as he is fixated on the admission process. He states that the last time he was hospitalized he thought he was doing good but things didn't change with his home environment and family conflict. He resides with her  parents and 64 year old sister. Patient is unemployed. Patient reports daily marijuana use and reports using shrooms 4 months ago. He reports occasional alcohol use. He denies outpatient psychiatry. He states that in  the past he was prescribed Abilify and Effexor and did really well on those medications. He denies taking medical medications.    Total Time spent with patient: 30 minutes  Musculoskeletal  Strength & Muscle Tone: within normal limits Gait & Station: normal Patient leans: N/A  Psychiatric Specialty Exam  Presentation General Appearance:  Casual  Eye Contact: Minimal  Speech: Pressured  Speech Volume: Increased  Handedness: Right   Mood and Affect  Mood: Depressed; Anxious; Labile  Affect: Tearful; Labile   Thought Process  Thought Processes: Irrevelant  Descriptions of Associations:Circumstantial  Orientation:Full (Time, Place and Person)  Thought Content:Rumination    Hallucinations:Hallucinations: None  Ideas of Reference:No data recorded Suicidal Thoughts:Suicidal Thoughts: Yes, Passive  Homicidal Thoughts:No data recorded  Sensorium  Memory: Immediate Fair; Recent Fair; Remote Fair  Judgment: Intact  Insight: Present   Executive Functions  Concentration: Poor  Attention Span: Poor  Recall: [employer] of Knowledge: Fair  Language: Fair   Psychomotor Activity  Psychomotor Activity: Psychomotor Activity: Restlessness   Assets  Assets: Communication Skills; Desire for Improvement; Housing; Leisure Time; Social Support   Sleep  Sleep: Sleep: Poor   Nutritional Assessment (For OBS and [facility] admissions only) Has the patient had a weight loss or gain of 10 pounds or more in the last 3 months?: No Has the patient had a decrease in food intake/or appetite?: No Does the patient have dental problems?: No Does the patient have eating habits or behaviors that may be indicators of an eating disorder including binging or inducing vomiting?: No Has the patient recently lost weight without trying?: 0 Has the patient been eating poorly because of a decreased appetite?: 0 Malnutrition Screening Tool Score: 0    Physical  Exam HENT:     Head: Normocephalic.  Pulmonary:     Effort: Pulmonary effort is normal.  Musculoskeletal:        General: Normal range of motion.     Cervical back: Normal range of motion.  Neurological:     Mental Status: She is alert and oriented to person, place, and time.    Review of Systems  Constitutional: Negative.   HENT: Negative.    Eyes: Negative.   Respiratory: Negative.    Cardiovascular: Negative.   Gastrointestinal: Negative.   Genitourinary: Negative.   Musculoskeletal: Negative.   Neurological: Negative.   Psychiatric/Behavioral:  Positive for depression and suicidal ideas. The patient is nervous/anxious and has insomnia.     Blood pressure (!) 141/71, pulse 82, temperature 98 F (36.7 C), temperature source Oral, resp. rate 18, SpO2 99%. There is no height or weight on file to calculate BMI.  Past Psychiatric History: History of bipolar mixed. Patient reports a history of PTSD, BPD, and MDD. Patient reports 8 past suicide attempts with last attempt being 3 to one month ago by attempting to take a steak knife and pushing it up against her temporal. He reports an inpatient psychiatric hospitalization in [place], [place]., in 2019.   Is the patient at risk to self? Yes  Has the patient been a risk to self in the past 6 months? Yes .    Has the patient been a risk to self within the distant past? Yes   Is the patient a risk to others? No   Has the  patient been a risk to others in the past 6 months? No   Has the patient been a risk to others within the distant past? No   Past Medical History: Patient reports a history of pneumonia and enlarged heart in 2019. Patient denies taking prescribed medications.   Family History: Patient suspects family history of mental illness but is unable to provide an accurate history.   Social History: Patient resides with her parents and 27 year old sister. Patient is unemployed. Patient reports daily marijuana use and reports using  shrooms 4 months ago. Patient reports occasional alcohol use.   Last Labs:  No visits with results within 6 Month(s) from this visit.  Latest known visit with results is:  Orders Only on 08/04/2020  Component Date Value Ref Range Status   Polio Virus Antibody Type 1 08/04/2020 1:40   Final   Polio Virus Antibody Type 3 08/04/2020 1:80   Final   Comment: INTERPRETIVE INFORMATION: Poliovirus (Types 1, 3) Antibodies  Less than 1:10:  No detectable poliovirus antibodies. 1:10 or greater:  Antibody to poliovirus detected, which may represent prior immunization or current or past infection.  The presence of neutralizing antibodies against poliovirus implies immunity. The serum neutralization test is serotype specific. Antibodies against one type does not indicate immunity against the other type.  Reference interval applies to Poliovirus Antibodies Types 1 and 3.     Allergies: Patient has no known allergies.  Medications:  Facility Ordered Medications  Medication   [COMPLETED] hydrOXYzine (ATARAX) tablet 25 mg   acetaminophen (TYLENOL) tablet 650 mg   alum & mag hydroxide-simeth (MAALOX/MYLANTA) 200-200-20 MG/5ML suspension 30 mL   magnesium hydroxide (MILK OF MAGNESIA) suspension 30 mL   hydrOXYzine (ATARAX) tablet 25 mg   ARIPiprazole (ABILIFY) tablet 2 mg   PTA Medications  Medication Sig   ARIPiprazole (ABILIFY) 2 MG tablet Take 2 mg by mouth at bedtime.   venlafaxine XR (EFFEXOR-XR) 150 MG 24 hr capsule TAKE 1 CAPSULE BY MOUTH IN THE MORNING   levonorgestrel-ethinyl estradiol (ALESSE) 0.1-20 MG-MCG tablet Take 1 tablet by mouth daily.   amphetamine-dextroamphetamine (ADDERALL XR) 20 MG 24 hr capsule Take 20 mg by mouth 2 (two) times daily.   traZODone (DESYREL) 50 MG tablet Take 50-150 mg by mouth at bedtime as needed.      Medical Decision Making  Patient recommended for inpatient psychiatric treatment for mood stabilization and medication management. Patient consent  to inpatient treatment. Patient is voluntary.   Patient is agreeable to restarting Abilify 2 mg po at bedtime.  Add vistaril 25 mg po TID for anxiety.   Lab Orders         CBC with Differential/Platelet         Comprehensive metabolic panel         Hemoglobin A1c         Ethanol         Lipid panel         TSH         POCT Urine Drug Screen - (I-Screen)         POC urine preg, ED    EKG    Recommendations  Based on my evaluation the patient does not appear to have an emergency medical condition.  Layla Barter, NP 02/22/23  5:35 PM

## 2023-02-22 NOTE — Progress Notes (Addendum)
   02/22/23 1253  [facility] Triage Screening (Walk-ins at [facility] only)  How Did You Hear About [place]? Family/Friend  What Is the Reason for Your Visit/Call Today? Anna [name] (He/Him, "Anna [name]") is a 27 year old female (female) who presents to [facility] accompanied by her father. Pt reports he is diagnosed with PTSD,BPD,MDD,ADHD,Dyslexia and is transgender. Pt states he has been without his medication and therapy since June 2024. Pt states he was unable to receive services because he no longer had insurance. Pt states he feels confused, paranoia, crying spells, over thinking, difficulty sleeping,decreased appetite, agitation,passive SI. Pt states he does not have a clear plan or intent to hurt himself. Pt reports he has been using marijuana and alcohol frequently. He also reports using shrooms 4 months ago. Pt reports hearing voices currently telling him "everythings going to be fine". Pt has tangential speech, jumping from one subject to the next, crying hysterically. Pt reports NSSIB in the past by choking herself.Pt reports past suicide attempt but states she cannot recall when it was and states she has difficulty with identifying real life from her "vivid thoughts". Pt reports his last hospitalization was in 2019 for SI/HI.Pt denies HI and Visual Hallucinations currently. She reports she was previously seen by Dr.Rupinder Evelene Croon, MD [phone], and [facility] [phone]). He reports he has 2 emotional support cats, and is prescribed Dextroamphetamine / Amphetamine ER 20mg , Dextroamphetamine. 10mg , aripiprazole 2mg , and venlafaxine ER 150mg .  How Long Has This Been Causing You Problems? 1-6 months  Have You Recently Had Any Thoughts About Hurting Yourself? Yes  How long ago did you have thoughts about hurting yourself? this week  Are You Planning to Commit Suicide/Harm Yourself At This time? No  Have you Recently Had Thoughts About Hurting Someone Karolee Ohs? No  Are You Planning To Harm Someone At  This Time? No  Are you currently experiencing any auditory, visual or other hallucinations? Yes  Please explain the hallucinations you are currently experiencing: hearing voices telling him "everythings going to be fine"  Have You Used Any Alcohol or Drugs in the Past 24 Hours? Yes  How long ago did you use Drugs or Alcohol? lastnight  What Did You Use and How Much? THC, alcohol  Do you have any current medical co-morbidities that require immediate attention? No  Clinician description of patient physical appearance/behavior: tearful, depressed mood  What Do You Feel Would Help You the Most Today? Treatment for Depression or other mood problem;Medication(s)  If access to [facility] was not available, would you have sought care in the Emergency Department? No  Determination of Need Urgent (48 hours)  Options For Referral Inpatient Hospitalization

## 2023-02-22 NOTE — BH Assessment (Signed)
Comprehensive Clinical Assessment (CCA) Note  02/22/2023 Anna [name] [id number]  DISPOSITION: White NP recommends an inpatient admission to assist with stabilization.   The patient demonstrates the following risk factors for suicide: Chronic risk factors for suicide include: psychiatric disorder of depression . Acute risk factors for suicide include:  depression . Protective factors for this patient include: positive social support. Considering these factors, the overall suicide risk at this point appears to be moderate. Patient is appropriate for outpatient follow up.   Patient is a 27 year old transgender female to female (prefers He/Him/His) and goes by "Anna [name]" who presents this date as a voluntary walk in to [facility] reporting passive S/I. Patient denies any immediate plan or intent. Patient denies any H/I or VH. Patient reports ongoing AH although is vague in reference to content.   Patient per chart review has a history significant for BPD,MDD,ADHD and Dyslexia. Patient states he had  received OP services in the past from [place] MD who assisted with medication management although has been without services since June 2024 when he no longer had insurance. Patient reports that they have tried to manage their symptoms although have found that in the last three months their symptoms have worsened to include: often feeling. "confused and lost," hopeless, frequent crying spells and racing thoughts.  Patient states he does not have a clear plan or intent to hurt himself this date. Patient states he has a past history of NSSIB to include choking himself although that was, "a long time ago." Patient reports using different amounts of cannabis and alcohol although is vague in reference to use patterns, last use, amounts consumed and time frame. He also reports using "mushrooms" 4 months ago.   Patient has tangential speech and is difficult to redirect. Patient goes from one unrelated topic to another and is a  poor historian. Patient reports past suicide attempt but states he cannot recall when it was and states he has difficulty with identifying real life from his "vivid thoughts". Patient reports his last hospitalization was in 2019 for SI/HI. Patient is oriented x 4. Patient is observed to be tearful throughout the assessment and renders limited history. Patient's memory appears to be intact although thoughts somewhat disorganized. Patient's mood is anxious with affect congruent. Patient does not appear to be responding to internal stimuli.   Chief Complaint:  Chief Complaint  Patient presents with   Medication Problem   Depression   Manic Behavior   Visit Diagnosis: MDD recurrent with psychotic features, severe, BPD, GAD, ADHD     CCA Screening, Triage and Referral (STR)  Patient Reported Information How did you hear about [place]? Self  What Is the Reason for Your Visit/Call Today? Patient is a 27 year old transgender female to female and goes by Anna [name] who presents to [facility] as a walk in brought in by his father with passive S/I. Patient has a history significant for BPD, MDD, ADHD and PTSD and had been receiving OP services from [place] MD who assisted with medication managment although has been without medications since June 2024 when his insurance ran out. Patient states since then he has been experiencing symptoms to include confusion, paranoia, crying spells, over thinking, difficulty sleeping, decreased appetite, agitation and passive SI. Pt states he does not have a clear plan or intent to hurt himself. Pt reports he has been using marijuana and alcohol frequently although is vague in reference to use patterns.  How Long Has This Been Causing You Problems? > than 6  months  What Do You Feel Would Help You the Most Today? Treatment for Depression or other mood problem; Medication(s)   Have You Recently Had Any Thoughts About Hurting Yourself? Yes  Are You Planning to Commit Suicide/Harm Yourself  At This time? No   Flowsheet Row ED from 02/22/2023 in [facility]  C-SSRS RISK CATEGORY Low Risk       Have you Recently Had Thoughts About Hurting Someone Karolee Ohs? No  Are You Planning to Harm Someone at This Time? No  Explanation: NA   Have You Used Any Alcohol or Drugs in the Past 24 Hours? No  What Did You Use and How Much? NA   Do You Currently Have a Therapist/Psychiatrist? No  Name of Therapist/Psychiatrist: Name of Therapist/Psychiatrist: NA   Have You Been Recently Discharged From Any Office Practice or Programs? No  Explanation of Discharge From Practice/Program: NA     CCA Screening Triage Referral Assessment Type of Contact: Face-to-Face  Telemedicine Service Delivery:   Is this Initial or Reassessment?   Date Telepsych consult ordered in [facility]:    Time Telepsych consult ordered in [facility]:    Location of Assessment: [facility] Assessment Services  Provider Location: [facility] Assessment Services   Collateral Involvement: None at this time   Does Patient Have a Automotive engineer Guardian? No  Legal Guardian Contact Information: NA  Copy of Legal Guardianship Form: -- (NA)  Legal Guardian Notified of Arrival: -- (NA)  Legal Guardian Notified of Pending Discharge: -- (NA)  If Minor and Not Living with Parent(s), Who has Custody? NA  Is CPS involved or ever been involved? Never  Is APS involved or ever been involved? Never   Patient Determined To Be At Risk for Harm To Self or Others Based on Review of Patient Reported Information or Presenting Complaint? Yes, for Self-Harm  Method: No Plan  Availability of Means: No access or NA  Intent: Vague intent or NA  Notification Required: No need or identified person  Additional Information for Danger to Others Potential: -- (NA)  Additional Comments for Danger to Others Potential: None noted  Are There Guns or Other Weapons in Your Home? No  Types of Guns/Weapons:  NA  Are These Weapons Safely Secured?                            -- (NA)  Who Could Verify You Are Able To Have These Secured: NA  Do You Have any Outstanding Charges, Pending Court Dates, Parole/Probation? Denies  Contacted To Inform of Risk of Harm To Self or Others: -- (NA)    Does Patient Present under Involuntary Commitment? No    [place] of Residence: [place]   Patient Currently Receiving the Following Services: Not Receiving Services   Determination of Need: Urgent (48 hours)   Options For Referral: Inpatient Hospitalization     CCA Biopsychosocial Patient Reported Schizophrenia/Schizoaffective Diagnosis in Past: No   Strengths: Patient is willing to participate in treatment and is open to mental health interventions   Mental Health Symptoms Depression:   Change in energy/activity; Fatigue; Hopelessness; Tearfulness; Irritability   Duration of Depressive symptoms:  Duration of Depressive Symptoms: Greater than two weeks   Mania:   None   Anxiety:    Difficulty concentrating; Irritability; Restlessness; Tension; Worrying   Psychosis:   Hallucinations   Duration of Psychotic symptoms:  Duration of Psychotic Symptoms: Less than six months  Trauma:   None   Obsessions:   None   Compulsions:   None   Inattention:   None   Hyperactivity/Impulsivity:   None   Oppositional/Defiant Behaviors:   None   Emotional Irregularity:   Chronic feelings of emptiness   Other Mood/Personality Symptoms:   None noted    Mental Status Exam Appearance and self-care  Stature:   Average   Weight:   Obese   Clothing:   Casual   Grooming:   Normal   Cosmetic use:   None   Posture/gait:   Normal   Motor activity:   Not Remarkable   Sensorium  Attention:   Normal   Concentration:   Normal   Orientation:   X5   Recall/memory:   Normal   Affect and Mood  Affect:   Anxious   Mood:   Anxious   Relating  Eye contact:    Normal   Facial expression:   Anxious   Attitude toward examiner:   Cooperative   Thought and Language  Speech flow:  Normal   Thought content:   Appropriate to Mood and Circumstances   Preoccupation:   None   Hallucinations:   Auditory   Organization:   Intact; Engineer, site of Knowledge:   Fair   Intelligence:   Average   Abstraction:   Normal   Judgement:   Fair   Dance movement psychotherapist:   Realistic   Insight:   Fair   Decision Making:   Only simple   Social Functioning  Social Maturity:   Isolates   Social Judgement:   Normal   Stress  Stressors:   School   Coping Ability:   Human resources officer Deficits:   None   Supports:   Support needed     Religion: Religion/Spirituality Are You A Religious Person?: No How Might This Affect Treatment?: NA  Leisure/Recreation: Leisure / Recreation Do You Have Hobbies?: No  Exercise/Diet: Exercise/Diet Do You Exercise?: No Have You Gained or Lost A Significant Amount of Weight in the Past Six Months?: No Do You Follow a Special Diet?: No Do You Have Any Trouble Sleeping?: Yes Explanation of Sleeping Difficulties: Pt reports only sleeping 4 to 5 hours a night   CCA Employment/Education Employment/Work Situation: Employment / Work Situation Employment Situation: Unemployed Patient's Job has Been Impacted by Current Illness: No Has Patient ever Been in Equities trader?: No  Education: Education Is Patient Currently Attending School?: No Last Grade Completed: 12 Did You Product manager?: No Did You Have An Individualized Education Program (IIEP): No Did You Have Any Difficulty At [employer]?: No Patient's Education Has Been Impacted by Current Illness: No   CCA Family/Childhood History Family and Relationship History: Family history Marital status: Single Does patient have children?: No  Childhood History:  Childhood History By whom was/is the patient  raised?: Both parents Did patient suffer any verbal/emotional/physical/sexual abuse as a child?: No Did patient suffer from severe childhood neglect?: No Has patient ever been sexually abused/assaulted/raped as an adolescent or adult?: No Was the patient ever a victim of a crime or a disaster?: No Witnessed domestic violence?: No Has patient been affected by domestic violence as an adult?: No       CCA Substance Use Alcohol/Drug Use: Alcohol / Drug Use Pain Medications: See MAR Prescriptions: See MAR Over the Counter: See MAR History of alcohol / drug use?: Yes Longest period of sobriety (when/how long): Unknown Negative Consequences of Use:  (  NA) Withdrawal Symptoms: None Substance #1 Name of Substance 1: Cannabis 1 - Age of First Use: 17 1 - Amount (size/oz): 1 gram or less 1 - Frequency: 2 to 3 times a week 1 - Duration: Ongoing for last year 1 - Last Use / Amount: Pt states over 48 hours ago, less than a gram 1 - Method of Aquiring: Illegal 1- Route of Use: Smoking Substance #2 Name of Substance 2: Alcohol 2 - Age of First Use: 17 2 - Amount (size/oz): Pt is vague in reference to use patterns amounts used etc 2 - Frequency: Pt is vague in reference to use patterns amounts used etc 2 - Duration: Ongoing for last year 2 - Last Use / Amount: Pt states, "they cannot recall" 2 - Method of Aquiring: Legal 2 - Route of Substance Use: Oral                     ASAM's:  Six Dimensions of Multidimensional Assessment  Dimension 1:  Acute Intoxication and/or Withdrawal Potential:   Dimension 1:  Description of individual's past and current experiences of substance use and withdrawal: Fully functioning  Dimension 2:  Biomedical Conditions and Complications:   Dimension 2:  Description of patient's biomedical conditions and  complications: Fully functioning  Dimension 3:  Emotional, Behavioral, or Cognitive Conditions and Complications:  Dimension 3:  Description of  emotional, behavioral, or cognitive conditions and complications: Good impulse control  Dimension 4:  Readiness to Change:  Dimension 4:  Description of Readiness to Change criteria: Willingly to participate in treatment  Dimension 5:  Relapse, Continued use, or Continued Problem Potential:  Dimension 5:  Relapse, continued use, or continued problem potential critiera description: Minimal relapse potential  Dimension 6:  Recovery/Living Environment:  Dimension 6:  Recovery/Iiving environment criteria description: Has supportive environment  ASAM Severity Score: ASAM's Severity Rating Score: 1  ASAM Recommended Level of Treatment: ASAM Recommended Level of Treatment: Level I Outpatient Treatment   Substance use Disorder (SUD) Substance Use Disorder (SUD)  Checklist Symptoms of Substance Use: Continued use despite having a persistent/recurrent physical/psychological problem caused/exacerbated by use  Recommendations for Services/Supports/Treatments: Recommendations for Services/Supports/Treatments Recommendations For Services/Supports/Treatments: Inpatient Hospitalization  Discharge Disposition:    DSM5 Diagnoses: Patient Active Problem List   Diagnosis Date Noted   Left knee pain 02/04/2019   Chlamydia- vaginal infection 01/25/2019   Hyperlipidemia 01/20/2019   Vitamin D deficiency 01/20/2019   PTSD (post-traumatic stress disorder)    Bipolar disorder, mixed (HCC)    Thoracic back pain 06/09/2018   Birth control counseling 06/09/2018   Depression, recurrent (HCC) 11/20/2017   H/O borderline personality disorder 11/20/2017   Healthcare maintenance 11/20/2017   Anxiety 11/20/2017   Morbid obesity (HCC) 11/20/2017   Borderline personality disorder (HCC) 11/20/2017     Referrals to Alternative Service(s): Referred to Alternative Service(s):   Place:   Date:   Time:    Referred to Alternative Service(s):   Place:   Date:   Time:    Referred to Alternative Service(s):   Place:    Date:   Time:    Referred to Alternative Service(s):   Place:   Date:   Time:     Alfredia Ferguson, LCAS

## 2023-02-22 NOTE — ED Notes (Signed)
Pt resting at this hour. No apparent distress. RR even and unlabored. Monitored for safety.  

## 2023-02-22 NOTE — Progress Notes (Addendum)
Addendum: Pt can transfer to [facility]U 0100. Care team was update.  Pt was accepted to [facility] TODAY  02/22/2023; Bed Assignment 307 PENDING Sign Voluntary consent uploaded to pt's chart or faxed to BMU FAX Number [phone].   Address: [address], [place], [place] [address]  Pt meets inpatient criteria per Loreen Freud  Attending Physician will be Dr. Shellee Milo  Report can be called to: [phone]  Pt can arrive after: [facility] RN to coordinate with care team.  Care Team notified: Night [facility] [place], Ifeanyi [place], [place] Coker,LPN, Rashaun Dixon,NP, [place], RN, Zeenat Rutledge, [facility] Rosey Bath, RN, [facility], [place], [place] [facility] [place], Patrice White,NP  Maryjean Ka, MSW, [facility] 02/22/2023 10:19 PM

## 2023-02-22 NOTE — Progress Notes (Signed)
Pt is admitted to [facility] due to passive SI with no plan or intent. Pt verbally contracts for safety on the unit. Pt was advised to inform staff when having intrusive thoughts of hurting self, others or to damage property. Pt was receptive. Pt is alert and oriented X3. Pt is ambulatory and is oriented to staff/unit. Pt was cooperative with labs and skin assessment. Pt complained of back pain. No signs of acute distress noted. Pt denies current HI/AVH. Staff will monitor for pt's safety.

## 2023-02-22 NOTE — ED Notes (Signed)
Patient admitted to obs. Oriented to unit, meal given. Patient is calm and composed, communicative with staff, pleasant demeanor. Patient in no apparent acute distress. Environment secured, safety checks in place per facility policy.

## 2023-02-23 ENCOUNTER — Other Ambulatory Visit: Payer: Self-pay

## 2023-02-23 ENCOUNTER — Encounter: Payer: Self-pay | Admitting: Psychiatric/Mental Health

## 2023-02-23 ENCOUNTER — Inpatient Hospital Stay
Admission: AD | Admit: 2023-02-23 | Discharge: 2023-03-01 | DRG: 885 | Disposition: A | Discharge status: Home / Self Care | Payer: PRIVATE HEALTH INSURANCE | Source: Intra-hospital | Attending: Psychiatry | Admitting: Psychiatry

## 2023-02-23 DIAGNOSIS — F319 Bipolar disorder, unspecified: Secondary | ICD-10-CM | POA: Diagnosis present

## 2023-02-23 DIAGNOSIS — Z803 Family history of malignant neoplasm of breast: Secondary | ICD-10-CM | POA: Diagnosis not present

## 2023-02-23 DIAGNOSIS — Z825 Family history of asthma and other chronic lower respiratory diseases: Secondary | ICD-10-CM | POA: Diagnosis not present

## 2023-02-23 DIAGNOSIS — Z8701 Personal history of pneumonia (recurrent): Secondary | ICD-10-CM | POA: Diagnosis not present

## 2023-02-23 DIAGNOSIS — Z560 Unemployment, unspecified: Secondary | ICD-10-CM | POA: Diagnosis not present

## 2023-02-23 DIAGNOSIS — F603 Borderline personality disorder: Secondary | ICD-10-CM | POA: Diagnosis present

## 2023-02-23 DIAGNOSIS — Z9151 Personal history of suicidal behavior: Secondary | ICD-10-CM

## 2023-02-23 DIAGNOSIS — Z9152 Personal history of nonsuicidal self-harm: Secondary | ICD-10-CM | POA: Diagnosis not present

## 2023-02-23 DIAGNOSIS — F17290 Nicotine dependence, other tobacco product, uncomplicated: Secondary | ICD-10-CM | POA: Diagnosis present

## 2023-02-23 DIAGNOSIS — Z818 Family history of other mental and behavioral disorders: Secondary | ICD-10-CM | POA: Diagnosis not present

## 2023-02-23 DIAGNOSIS — F322 Major depressive disorder, single episode, severe without psychotic features: Principal | ICD-10-CM | POA: Diagnosis present

## 2023-02-23 DIAGNOSIS — F4310 Post-traumatic stress disorder, unspecified: Secondary | ICD-10-CM | POA: Diagnosis present

## 2023-02-23 DIAGNOSIS — F411 Generalized anxiety disorder: Secondary | ICD-10-CM | POA: Diagnosis present

## 2023-02-23 DIAGNOSIS — R480 Dyslexia and alexia: Secondary | ICD-10-CM | POA: Diagnosis present

## 2023-02-23 DIAGNOSIS — Z808 Family history of malignant neoplasm of other organs or systems: Secondary | ICD-10-CM

## 2023-02-23 DIAGNOSIS — Z5986 Financial insecurity: Secondary | ICD-10-CM

## 2023-02-23 DIAGNOSIS — F640 Transsexualism: Secondary | ICD-10-CM | POA: Diagnosis present

## 2023-02-23 DIAGNOSIS — R45851 Suicidal ideations: Secondary | ICD-10-CM | POA: Diagnosis present

## 2023-02-23 DIAGNOSIS — Z800 Family history of malignant neoplasm of digestive organs: Secondary | ICD-10-CM

## 2023-02-23 DIAGNOSIS — F22 Delusional disorders: Secondary | ICD-10-CM | POA: Diagnosis present

## 2023-02-23 DIAGNOSIS — F909 Attention-deficit hyperactivity disorder, unspecified type: Secondary | ICD-10-CM | POA: Diagnosis present

## 2023-02-23 DIAGNOSIS — Z832 Family history of diseases of the blood and blood-forming organs and certain disorders involving the immune mechanism: Secondary | ICD-10-CM

## 2023-02-23 DIAGNOSIS — G4700 Insomnia, unspecified: Secondary | ICD-10-CM | POA: Diagnosis present

## 2023-02-23 DIAGNOSIS — F3163 Bipolar disorder, current episode mixed, severe, without psychotic features: Principal | ICD-10-CM | POA: Diagnosis present

## 2023-02-23 DIAGNOSIS — I517 Cardiomegaly: Secondary | ICD-10-CM | POA: Diagnosis present

## 2023-02-23 DIAGNOSIS — Z9889 Other specified postprocedural states: Secondary | ICD-10-CM

## 2023-02-23 DIAGNOSIS — Z79899 Other long term (current) drug therapy: Secondary | ICD-10-CM | POA: Diagnosis not present

## 2023-02-23 MED ORDER — HALOPERIDOL LACTATE 5 MG/ML IJ SOLN: 5.0000 mg | Freq: Three times a day (TID) | INTRAMUSCULAR | Status: DC | PRN

## 2023-02-23 MED ORDER — HYDROXYZINE HCL 25 MG PO TABS
25.0000 mg | ORAL_TABLET | Freq: Three times a day (TID) | ORAL | Status: DC | PRN
  Administered 2023-02-24 – 2023-02-26 (×5): 25 mg via ORAL
  Filled 2023-02-23 (×5): qty 1

## 2023-02-23 MED ORDER — MAGNESIUM HYDROXIDE 400 MG/5ML PO SUSP: 30.0000 mL | Freq: Every day | ORAL | Status: DC | PRN

## 2023-02-23 MED ORDER — ACETAMINOPHEN 325 MG PO TABS
650.0000 mg | ORAL_TABLET | Freq: Four times a day (QID) | ORAL | Status: DC | PRN
  Administered 2023-02-25 – 2023-03-01 (×4): 650 mg via ORAL
  Filled 2023-02-23 (×4): qty 2

## 2023-02-23 MED ORDER — HALOPERIDOL 5 MG PO TABS: 5.0000 mg | ORAL_TABLET | Freq: Three times a day (TID) | ORAL | Status: DC | PRN

## 2023-02-23 MED ORDER — ARIPIPRAZOLE 2 MG PO TABS
2.0000 mg | ORAL_TABLET | Freq: Every day | ORAL | Status: DC
  Administered 2023-02-23 – 2023-02-28 (×6): 2 mg via ORAL
  Filled 2023-02-23 (×6): qty 1

## 2023-02-23 MED ORDER — DIPHENHYDRAMINE HCL 25 MG PO CAPS: 50.0000 mg | ORAL_CAPSULE | Freq: Three times a day (TID) | ORAL | Status: DC | PRN

## 2023-02-23 MED ORDER — ALUM & MAG HYDROXIDE-SIMETH 200-200-20 MG/5ML PO SUSP: 30.0000 mL | ORAL | Status: DC | PRN

## 2023-02-23 MED ORDER — DIPHENHYDRAMINE HCL 50 MG/ML IJ SOLN: 50.0000 mg | Freq: Three times a day (TID) | INTRAMUSCULAR | Status: DC | PRN

## 2023-02-23 MED ORDER — TRAZODONE HCL 50 MG PO TABS
50.0000 mg | ORAL_TABLET | Freq: Every evening | ORAL | Status: DC | PRN
  Administered 2023-02-25: 50 mg via ORAL
  Filled 2023-02-23: qty 1

## 2023-02-23 MED ORDER — LORAZEPAM 2 MG/ML IJ SOLN: 2.0000 mg | Freq: Three times a day (TID) | INTRAMUSCULAR | Status: DC | PRN

## 2023-02-23 MED ORDER — LORAZEPAM 2 MG PO TABS
2.0000 mg | ORAL_TABLET | Freq: Three times a day (TID) | ORAL | Status: DC | PRN
  Administered 2023-02-23: 2 mg via ORAL
  Filled 2023-02-23: qty 1

## 2023-02-23 NOTE — Group Note (Signed)
Date:  02/23/2023 Time:  9:26 PM  Group Topic/Focus:  Wrap-Up Group:   The focus of this group is to help patients review their daily goal of treatment and discuss progress on daily workbooks.    Participation Level:  Did Not Attend  Additional Comments:  Pt was having a breakdown during the time of wrap up group. Several other pts were comforting said pt out in the hallway. Pt wanted her belongings that visitor had brought as well as her support blanket. Pt was informed that she would have to talk to the provider about getting her blanket. Pt was also told that right after group and snack staff would get to her belonging search and get her the approved items.   Maglione,Jamaya Sleeth E 02/23/2023, 9:26 PM

## 2023-02-23 NOTE — Progress Notes (Signed)
Patient admitted to [facility] from [facility] voluntarily. Patient presents very sad, tearful and anxious. He is transgender and identifies as Clinical biochemist. He is Ox4 but reports his thoughts are overwhelming him and he believes people are judging him. He was unable to answer most questioning during admission due to crying and overwhelming thoughts of being judged by others. He is currently resting in bed eyes closed.

## 2023-02-23 NOTE — Progress Notes (Signed)
D- Patient alert and oriented x 4. Affect anxious/mood congruent. Denies SI/ HI/ AVH. Patient denies pain. Patient endorses depression and anxiety He states she came in "feeling suicidal ,anxious and overwhelmed". A- There were no scheduled medications administered to patient, per MD orders. Support and encouragement provided.  Routine safety checks conducted every 15 minutes without incident.  Patient informed to notify staff with problems or concerns and verbalizes understanding. R-  Patient compliant with treatment plan. Patient receptive and cooperative. He interacts well with others on the unit.  Patient contracts for safety and  remains safe on the unit at this time.

## 2023-02-23 NOTE — Tx Team (Signed)
Initial Treatment Plan 02/23/2023 3:12 AM Anna [name] [id number]    PATIENT STRESSORS: Health problems   Medication change or noncompliance     PATIENT STRENGTHS: Physical Health  Supportive family/friends    PATIENT IDENTIFIED PROBLEMS: Depression   Sexual identity issue                   DISCHARGE CRITERIA:  Improved stabilization in mood, thinking, and/or behavior Verbal commitment to aftercare and medication compliance  PRELIMINARY DISCHARGE PLAN: Return to previous living arrangement  PATIENT/FAMILY INVOLVEMENT: This treatment plan has been presented to and reviewed with the patient, Anna [name], and/or family member  The patient and family have been given the opportunity to ask questions and make suggestions.  Furqan Gosselin, RN 02/23/2023, 3:12 AM
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
## 2023-02-23 NOTE — Group Note (Signed)
Date:  02/23/2023 Time:  12:42 PM  Group Topic/Focus:  Goals Group:   The focus of this group is to help patients establish daily goals to achieve during treatment and discuss how the patient can incorporate goal setting into their daily lives to aide in recovery.    Participation Level:  Did Not Attend  Participation Quality:    Affect:    Cognitive:    Insight:   Engagement in Group:    Modes of Intervention:    Additional Comments:    Elham Fini 02/23/2023, 12:42 PM

## 2023-02-23 NOTE — Plan of Care (Signed)
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
## 2023-02-23 NOTE — ED Notes (Signed)
Report given to [facility] [email or web address]

## 2023-02-23 NOTE — Plan of Care (Signed)
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
## 2023-02-23 NOTE — Plan of Care (Signed)
  Problem: Education: Goal: Knowledge of Anna [name] General Education information/materials will improve 02/23/2023 0308 by Loreen Freud, RN Outcome: Progressing 02/23/2023 0308 by Loreen Freud, RN Outcome: Progressing Goal: Emotional status will improve 02/23/2023 0308 by Loreen Freud, RN Outcome: Progressing 02/23/2023 0308 by Loreen Freud, RN Outcome: Progressing Goal: Mental status will improve 02/23/2023 0308 by Loreen Freud, RN Outcome: Progressing 02/23/2023 0308 by Loreen Freud, RN Outcome: Progressing Goal: Verbalization of understanding the information provided will improve 02/23/2023 0308 by Loreen Freud, RN Outcome: Progressing 02/23/2023 0308 by Loreen Freud, RN Outcome: Progressing   Problem: Activity: Goal: Interest or engagement in activities will improve 02/23/2023 0308 by Loreen Freud, RN Outcome: Progressing 02/23/2023 0308 by Loreen Freud, RN Outcome: Progressing Goal: Sleeping patterns will improve 02/23/2023 0308 by Loreen Freud, RN Outcome: Progressing 02/23/2023 0308 by Loreen Freud, RN Outcome: Progressing   Problem: Coping: Goal: Ability to verbalize frustrations and anger appropriately will improve 02/23/2023 0308 by Loreen Freud, RN Outcome: Progressing 02/23/2023 0308 by Loreen Freud, RN Outcome: Progressing Goal: Ability to demonstrate self-control will improve 02/23/2023 0308 by Loreen Freud, RN Outcome: Progressing 02/23/2023 0308 by Loreen Freud, RN Outcome: Progressing   Problem: Health Behavior/Discharge Planning: Goal: Identification of resources available to assist in meeting health care needs will improve 02/23/2023 0308 by Loreen Freud, RN Outcome: Progressing 02/23/2023 0308 by Loreen Freud, RN Outcome: Progressing Goal: Compliance with treatment plan for underlying cause of condition will improve 02/23/2023 0308 by Loreen Freud, RN Outcome: Progressing 02/23/2023 0308 by Loreen Freud, RN Outcome:  Progressing   Problem: Physical Regulation: Goal: Ability to maintain clinical measurements within normal limits will improve 02/23/2023 0308 by Loreen Freud, RN Outcome: Progressing 02/23/2023 0308 by Loreen Freud, RN Outcome: Progressing   Problem: Safety: Goal: Periods of time without injury will increase 02/23/2023 0308 by Loreen Freud, RN Outcome: Progressing 02/23/2023 0308 by Loreen Freud, RN Outcome: Progressing

## 2023-02-24 DIAGNOSIS — F319 Bipolar disorder, unspecified: Secondary | ICD-10-CM

## 2023-02-24 MED ORDER — VENLAFAXINE HCL 37.5 MG PO TABS
37.5000 mg | ORAL_TABLET | Freq: Two times a day (BID) | ORAL | Status: DC
Start: 2023-02-24 — End: 2023-02-25
  Administered 2023-02-24 – 2023-02-25 (×3): 37.5 mg via ORAL
  Filled 2023-02-24 (×3): qty 1

## 2023-02-24 NOTE — Group Note (Signed)
[facility] LCSW Group Therapy Note   Group Date: 02/24/2023 Start Time: 1550 End Time: 1635   Type of Therapy/Topic:  Group Therapy:  Emotion Regulation  Participation Level:  Active   Mood:  Description of Group:    The purpose of this group is to assist patients in learning to regulate negative emotions and experience positive emotions. Patients will be guided to discuss ways in which they have been vulnerable to their negative emotions. These vulnerabilities will be juxtaposed with experiences of positive emotions or situations, and patients challenged to use positive emotions to combat negative ones. Special emphasis will be placed on coping with negative emotions in conflict situations, and patients will process healthy conflict resolution skills.  Therapeutic Goals: Patient will identify two positive emotions or experiences to reflect on in order to balance out negative emotions:  Patient will label two or more emotions that they find the most difficult to experience:  Patient will be able to demonstrate positive conflict resolution skills through discussion or role plays:   Summary of Patient Progress:  The patient attended group. The patient participated during the game of Emotional Regulation BINGO. The patient participated during the group discussion.     Therapeutic Modalities:   Cognitive Behavioral Therapy Feelings Identification Dialectical Behavioral Therapy   Marshell Levan, LCSW

## 2023-02-24 NOTE — Group Note (Signed)
Date:  02/24/2023 Time:  9:14 PM  Group Topic/Focus:  Wrap-Up Group:   The focus of this group is to help patients review their daily goal of treatment and discuss progress on daily workbooks.    Participation Level:  Active  Participation Quality:  Monopolizing and Resistant  Affect:  Tearful  Cognitive:  Alert  Insight: Limited  Engagement in Group:  Poor  Modes of Intervention:  Discussion and Support  Additional Comments:     [name],Anna Hollingshead E 02/24/2023, 9:14 PM

## 2023-02-24 NOTE — Group Note (Signed)
Date:  02/24/2023 Time:  4:19 PM  Group Topic/Focus:  OUTDOOR RECREATION STRUCTURED ACTIVITY    Participation Level:  Active  Participation Quality:  Appropriate  Affect:  Appropriate  Cognitive:  Appropriate  Insight: Appropriate  Engagement in Group:  Developing/Improving  Modes of Intervention:  Activity  Additional Comments:    July Linam 02/24/2023, 4:19 PM

## 2023-02-24 NOTE — Group Note (Signed)
Date:  02/24/2023 Time:  10:15 AM  Group Topic/Focus:  Conflict Resolution:   The focus of this group is to discuss the conflict resolution process and how it may be used upon discharge. Goals Group:   The focus of this group is to help patients establish daily goals to achieve during treatment and discuss how the patient can incorporate goal setting into their daily lives to aide in recovery. Healthy Communication:   The focus of this group is to discuss communication, barriers to communication, as well as healthy ways to communicate with others.    Participation Level:  Active  Participation Quality:  Appropriate  Affect:  Appropriate  Cognitive:  Alert and Appropriate  Insight: Appropriate  Engagement in Group:  Developing/Improving  Modes of Intervention:  Activity, Discussion, and Education  Additional Comments:    Anna [name] 02/24/2023, 10:15 AM

## 2023-02-24 NOTE — Plan of Care (Signed)
Patient was anxious and Prn meds was given.

## 2023-02-24 NOTE — Plan of Care (Signed)

## 2023-02-24 NOTE — Progress Notes (Signed)
D- Patient alert and oriented x 4. Affect anxious/mood congruent. Denies SI/ HI/ AVH. Patient denies pain. Patient endorses depression and anxiety. Her goal today is "just to be better and just be here". A- Scheduled medications administered to patient, per MD orders. Support and encouragement provided.  Routine safety checks conducted every 15 minutes without incident.  Patient informed to notify staff with problems or concerns and verbalizes understanding. R- No adverse drug reactions noted.  Patient compliant with medications and treatment plan. Patient receptive, calm and cooperative. She interacts well with others on the unit.  Patient contracts for safety and  remains safe on the unit at this time.

## 2023-02-24 NOTE — Progress Notes (Signed)
[facility] MD Progress Note  02/24/2023 10:00 AM Rylynn Kobs  MRN:  [id number]  Subjective:  Loni "Monico Blitz" Petkus reported " I had a pretty good day my mom came to see me which really surprised me."  Evaluation: " Jaylin" was seen and evaluated face-to-face by this provider.  Continues to endorse passive suicidal ideations.  Denying plan or intent.  Reports frequent thoughts due to multiple stressors.  Monico Blitz stated recent relocation from [facility] to [place] reports they currently reside with their parents.  Reports some frustration related to unemployment at the time.  Has not been able to re-enrolled in school.   Monico Blitz stated that thy have been off her medication for the past 3 months which is caused their mood to fluctuate.  States utilizing weed to help stabilize however, feels that this may have made symptoms worse.  Reports a history related to dyslexia and attention deficit disorder.  States " I had vivid dream and seeing 3D imagery, this is difficult to explain this to therapy and psychiatrist."  States that their last inpatient admission was in 2019 due to a history of self injures behaviors.  Monico Blitz has a charted history with borderline personality disorder, major depressive disorder and generalized anxiety disorder.  Posttraumatic stress disorder, bipolar disorder.  Currently prescribed Abilify 2 mg daily.  Restarted Effexor at 37.5 mg p.o. twice daily.  Patient was receptive to plan.  Staff to continue to monitor for safety.  Support, encouragement  and reassurance was provided.  During evaluation Nyelle Wolfson is sitting. They are alert/oriented x 3; calm/cooperative; and mood congruent with affect.  Patient is speaking in a clear tone at moderate volume, and normal pace; with good eye contact.  Patient's thought process is coherent and relevant; There is no indication that ' they" are currently responding to internal/external stimuli or experiencing delusional thought  content.  Patient denies suicidal/self-harm/homicidal ideation, psychosis, and paranoia.  Patient has remained calm throughout assessment and has answered questions appropriately.   Principal Problem: Bipolar 1 disorder (HCC) Diagnosis: Principal Problem:   Bipolar 1 disorder (HCC)  Total Time spent with patient: 15 minutes  Past Psychiatric History:   Past Medical History:  Past Medical History:  Diagnosis Date   Anxiety    Borderline personality disorder (HCC)    Depression    Depression    Phreesia 02/24/2020   Enlarged heart    Pneumonia    PTSD (post-traumatic stress disorder)     Past Surgical History:  Procedure Laterality Date   WISDOM TOOTH EXTRACTION     Family History:  Family History  Problem Relation Age of Onset   Clotting disorder Mother    Cancer Father    COPD Father        skin   Depression Father    Healthy Sister    Healthy Brother    Cancer Maternal Grandmother        breast   Colon cancer Paternal Grandmother    Family Psychiatric  History:  Social History:  Social History   Substance and Sexual Activity  Alcohol Use Not Currently     Social History   Substance and Sexual Activity  Drug Use Not Currently   Comment: 2017 Marijuana    Social History   Socioeconomic History   Marital status: Single    Spouse name: Not on file   Number of children: 0   Years of education: Not on file   Highest education level: Not on file  Occupational History  Not on file  Tobacco Use   Smoking status: Former    Current packs/day: 0.00    Average packs/day: 0.3 packs/day for 3.0 years (0.8 ttl pk-yrs)    Types: Cigarettes    Start date: 10/22/2014    Quit date: 10/21/2017    Years since quitting: 5.3   Smokeless tobacco: Never  Vaping Use   Vaping status: Every Day  Substance and Sexual Activity   Alcohol use: Not Currently   Drug use: Not Currently    Comment: 2017 Marijuana   Sexual activity: Not Currently    Partners: Male    Birth  control/protection: Implant  Other Topics Concern   Not on file  Social History Narrative   Not on file   Social Determinants of Health   Financial Resource Strain: Not on file  Food Insecurity: Not on file  Transportation Needs: Patient Declined (02/23/2023)   PRAPARE - Administrator, Civil Service (Medical): Patient declined    Lack of Transportation (Non-Medical): Patient declined  Physical Activity: Not on file  Stress: Not on file  Social Connections: Not on file   Additional Social History:                         Sleep: Fair  Appetite:  Good  Current Medications: Current Facility-Administered Medications  Medication Dose Route Frequency Provider Last Rate Last Admin   acetaminophen (TYLENOL) tablet 650 mg  650 mg Oral Q6H PRN Jearld Lesch, NP       alum & mag hydroxide-simeth (MAALOX/MYLANTA) 200-200-20 MG/5ML suspension 30 mL  30 mL Oral Q4H PRN Durwin Nora, Rashaun M, NP       ARIPiprazole (ABILIFY) tablet 2 mg  2 mg Oral QHS Dixon, Rashaun M, NP   2 mg at 02/23/23 2125   diphenhydrAMINE (BENADRYL) capsule 50 mg  50 mg Oral TID PRN Jearld Lesch, NP       Or   diphenhydrAMINE (BENADRYL) injection 50 mg  50 mg Intramuscular TID PRN Dixon, Rashaun M, NP       haloperidol (HALDOL) tablet 5 mg  5 mg Oral TID PRN Jearld Lesch, NP       Or   haloperidol lactate (HALDOL) injection 5 mg  5 mg Intramuscular TID PRN Jearld Lesch, NP       hydrOXYzine (ATARAX) tablet 25 mg  25 mg Oral TID PRN Jearld Lesch, NP       magnesium hydroxide (MILK OF MAGNESIA) suspension 30 mL  30 mL Oral Daily PRN Dixon, Rashaun M, NP       traZODone (DESYREL) tablet 50 mg  50 mg Oral QHS PRN Dixon, Rashaun M, NP       venlafaxine (EFFEXOR) tablet 37.5 mg  37.5 mg Oral BID WC Oneta Rack, NP        Lab Results:  Results for orders placed or performed during the hospital encounter of 02/22/23 (from the past 48 hour(s))  Pregnancy, urine POC     Status: None    Collection Time: 02/22/23  6:05 PM  Result Value Ref Range   Preg Test, Ur NEGATIVE NEGATIVE    Comment:        THE SENSITIVITY OF THIS METHODOLOGY IS >24 mIU/mL   POCT Urine Drug Screen - (I-Screen)     Status: Abnormal   Collection Time: 02/22/23  6:07 PM  Result Value Ref Range   POC Amphetamine UR None Detected NONE  DETECTED (Cut Off Level 1000 ng/mL)   POC Secobarbital (BAR) None Detected NONE DETECTED (Cut Off Level 300 ng/mL)   POC Buprenorphine (BUP) None Detected NONE DETECTED (Cut Off Level 10 ng/mL)   POC Oxazepam (BZO) None Detected NONE DETECTED (Cut Off Level 300 ng/mL)   POC Cocaine UR None Detected NONE DETECTED (Cut Off Level 300 ng/mL)   POC Methamphetamine UR None Detected NONE DETECTED (Cut Off Level 1000 ng/mL)   POC Morphine None Detected NONE DETECTED (Cut Off Level 300 ng/mL)   POC Methadone UR None Detected NONE DETECTED (Cut Off Level 300 ng/mL)   POC Oxycodone UR None Detected NONE DETECTED (Cut Off Level 100 ng/mL)   POC Marijuana UR Positive (A) NONE DETECTED (Cut Off Level 50 ng/mL)  Hemoglobin A1c     Status: None   Collection Time: 02/22/23  7:14 PM  Result Value Ref Range   Hgb A1c MFr Bld 5.1 4.8 - 5.6 %    Comment: (NOTE) Pre diabetes:          5.7%-6.4%  Diabetes:              >6.4%  Glycemic control for   <7.0% adults with diabetes    Mean Plasma Glucose 99.67 mg/dL    Comment: Performed at [facility], [address], [place], [place] [address]  Ethanol     Status: None   Collection Time: 02/22/23  7:14 PM  Result Value Ref Range   Alcohol, Ethyl (B) <10 <10 mg/dL    Comment: (NOTE) Lowest detectable limit for serum alcohol is 10 mg/dL.  For medical purposes only. Performed at [facility], [address], [place], [place] [address]   CBC with Differential/Platelet     Status: Abnormal   Collection Time: 02/22/23  7:15 PM  Result Value Ref Range   WBC 10.5 4.0 - 10.5 K/uL   RBC 4.53 3.87 - 5.11 MIL/uL   Hemoglobin  13.1 12.0 - 15.0 g/dL   HCT 57.8 46.9 - 62.9 %   MCV 87.0 80.0 - 100.0 fL   MCH 28.9 26.0 - 34.0 pg   MCHC 33.2 30.0 - 36.0 g/dL   RDW 52.8 41.3 - 24.4 %   Platelets 407 (H) 150 - 400 K/uL   nRBC 0.0 0.0 - 0.2 %   Neutrophils Relative % 76 %   Neutro Abs 8.0 (H) 1.7 - 7.7 K/uL   Lymphocytes Relative 17 %   Lymphs Abs 1.7 0.7 - 4.0 K/uL   Monocytes Relative 7 %   Monocytes Absolute 0.7 0.1 - 1.0 K/uL   Eosinophils Relative 0 %   Eosinophils Absolute 0.0 0.0 - 0.5 K/uL   Basophils Relative 0 %   Basophils Absolute 0.0 0.0 - 0.1 K/uL   Immature Granulocytes 0 %   Abs Immature Granulocytes 0.04 0.00 - 0.07 K/uL    Comment: Performed at [facility], [address], [place], [place] [address]  Comprehensive metabolic panel     Status: None   Collection Time: 02/22/23  7:15 PM  Result Value Ref Range   Sodium 140 135 - 145 mmol/L   Potassium 3.7 3.5 - 5.1 mmol/L   Chloride 106 98 - 111 mmol/L   CO2 25 22 - 32 mmol/L   Glucose, Bld 93 70 - 99 mg/dL    Comment: Glucose reference range applies only to samples taken after fasting for at least 8 hours.   BUN 6 6 - 20 mg/dL  Creatinine, Ser 0.83 0.44 - 1.00 mg/dL   Calcium 9.5 8.9 - 29.5 mg/dL   Total Protein 7.3 6.5 - 8.1 g/dL   Albumin 3.9 3.5 - 5.0 g/dL   AST 21 15 - 41 U/L   ALT 31 0 - 44 U/L   Alkaline Phosphatase 63 38 - 126 U/L   Total Bilirubin 0.7 0.3 - 1.2 mg/dL   GFR, Estimated >28 >41 mL/min    Comment: (NOTE) Calculated using the CKD-EPI Creatinine Equation (2021)    Anion gap 9 5 - 15    Comment: Performed at [facility], [address], [place], [place] [address]  Lipid panel     Status: Abnormal   Collection Time: 02/22/23  7:15 PM  Result Value Ref Range   Cholesterol 194 0 - 200 mg/dL   Triglycerides 45 <102 mg/dL   HDL 55 >72 mg/dL   Total CHOL/HDL Ratio 3.5 RATIO   VLDL 9 0 - 40 mg/dL   LDL Cholesterol 536 (H) 0 - 99 mg/dL    Comment:        Total Cholesterol/HDL:CHD Risk Coronary Heart  Disease Risk Table                     Men   Women  1/2 Average Risk   3.4   3.3  Average Risk       5.0   4.4  2 X Average Risk   9.6   7.1  3 X Average Risk  23.4   11.0        Use the calculated Patient Ratio above and the CHD Risk Table to determine the patient's CHD Risk.        ATP III CLASSIFICATION (LDL):  <100     mg/dL   Optimal  644-034  mg/dL   Near or Above                    Optimal  130-159  mg/dL   Borderline  742-595  mg/dL   High  >638     mg/dL   Very High Performed at [facility], [address] [place], [place] [address]   TSH     Status: None   Collection Time: 02/22/23  7:15 PM  Result Value Ref Range   TSH 2.533 0.350 - 4.500 uIU/mL    Comment: Performed by a 3rd Generation assay with a functional sensitivity of <=0.01 uIU/mL. Performed at [facility], [address], [place], [place] [address]     Blood Alcohol level:  Lab Results  Component Value Date   ETH <10 02/22/2023    Metabolic Disorder Labs: Lab Results  Component Value Date   HGBA1C 5.1 02/22/2023   MPG 99.67 02/22/2023   No results found for: "PROLACTIN" Lab Results  Component Value Date   CHOL 194 02/22/2023   TRIG 45 02/22/2023   HDL 55 02/22/2023   CHOLHDL 3.5 02/22/2023   VLDL 9 02/22/2023   LDLCALC 130 (H) 02/22/2023   LDLCALC 108 (H) 03/01/2020    Physical Findings: AIMS:  , ,  ,  ,    CIWA:    COWS:     Musculoskeletal: Strength & Muscle Tone: within normal limits Gait & Station: normal Patient leans: N/A  Psychiatric Specialty Exam:  Presentation  General Appearance:  Appropriate for Environment  Eye Contact: Good  Speech: Clear and Coherent  Speech Volume: Normal  Handedness: Right  Mood and Affect  Mood: Anxious  Affect: Congruent   Thought Process  Thought Processes: Goal Directed; Coherent  Descriptions of Associations:Intact  Orientation:Full (Time, Place and Person)  Thought Content:Logical  History of  Schizophrenia/Schizoaffective disorder:No  Duration of Psychotic Symptoms:N/A  Hallucinations:Hallucinations: None  Ideas of Reference:None  Suicidal Thoughts:Suicidal Thoughts: Yes, Passive SI Passive Intent and/or Plan: Without Plan; Without Intent  Homicidal Thoughts:Homicidal Thoughts: No   Sensorium  Memory: Immediate Fair; Recent Fair  Judgment: Fair  Insight: Fair   Art therapist  Concentration: Fair  Attention Span: Good  Recall: Good  Fund of Knowledge: Fair  Language: Good   Psychomotor Activity  Psychomotor Activity: Psychomotor Activity: Normal   Assets  Assets: Desire for Improvement; Social Support   Sleep  Sleep: Sleep: Fair Number of Hours of Sleep: 6    Physical Exam: Physical Exam Vitals and nursing note reviewed.  Constitutional:      Appearance: Normal appearance.  Cardiovascular:     Rate and Rhythm: Normal rate.  Skin:    General: Skin is warm and dry.  Neurological:     Mental Status: He is alert.  Psychiatric:        Mood and Affect: Mood normal.        Thought Content: Thought content normal.    Review of Systems  Psychiatric/Behavioral:  Positive for depression and suicidal ideas (passive ideations). The patient is nervous/anxious.   All other systems reviewed and are negative.  Blood pressure (!) 95/51, pulse 75, temperature 97.6 F (36.4 C), resp. rate 17, height 5\' 4"  (1.626 m), weight 127.9 kg, SpO2 100%. Body mass index is 48.41 kg/m.   Treatment Plan Summary: Daily contact with patient to assess and evaluate symptoms and progress in treatment and Medication management  Continue with current treatment plan  on 02/24/2023 as listed below except for noted  Borderline personality disorder Major depressive disorder  Generalized anxiety disorder.   Posttraumatic stress disorder, bipolar disorder  Restart venlafaxine 37.5 mg p.o. twice daily Continue Abilify 2 mg daily Continue trazodone 50  mg nightly Continue hydroxyzine 25 mg p.o. 3 times daily   CSW to continue working on discharge disposition Patient encouraged to participate within the therapeutic milieu  Oneta Rack, NP 02/24/2023, 10:00 AM

## 2023-02-24 NOTE — [facility] Suicide Risk Assessment (Signed)
[facility] INPATIENT:  Family/Significant Other Suicide Prevention Education  Suicide Prevention Education:  Education Completed; Anna [name], mom, Anna [name] dad, [phone],   has been identified by the patient as the family member/significant other with whom the patient will be residing, and identified as the person(s) who will aid the patient in the event of a mental health crisis (suicidal ideations/suicide attempt).  With written consent from the patient, the family member/significant other has been provided the following suicide prevention education, prior to the and/or following the discharge of the patient.  The suicide prevention education provided includes the following: Suicide risk factors Suicide prevention and interventions National Suicide Hotline telephone number [facility] assessment telephone number [facility] Emergency Assistance 911 [facility] and/or Residential Mobile Crisis Unit telephone number  Request made of family/significant other to: Remove weapons (e.g., guns, rifles, knives), all items previously/currently identified as safety concern.   Remove drugs/medications (over-the-counter, prescriptions, illicit drugs), all items previously/currently identified as a safety concern.  The family member/significant other verbalizes understanding of the suicide prevention education information provided.  The family member/significant other agrees to remove the items of safety concern listed above.  The LCSWA contacted the patient parents to provide SPI. The LCSWA informed the parents of the warning signs and what to do. The patient mom stated that there were no guns in the home and that that there were no concerns at the moment.     Marshell Levan 02/24/2023, 12:51 PM

## 2023-02-25 DIAGNOSIS — F1210 Cannabis abuse, uncomplicated: Secondary | ICD-10-CM | POA: Insufficient documentation

## 2023-02-25 DIAGNOSIS — F319 Bipolar disorder, unspecified: Secondary | ICD-10-CM

## 2023-02-25 MED ORDER — VENLAFAXINE HCL 25 MG PO TABS
50.0000 mg | ORAL_TABLET | Freq: Two times a day (BID) | ORAL | Status: DC
  Administered 2023-02-26 – 2023-03-01 (×7): 50 mg via ORAL
  Filled 2023-02-25 (×8): qty 2

## 2023-02-25 MED ORDER — ALBUTEROL SULFATE HFA 108 (90 BASE) MCG/ACT IN AERS
2.0000 | INHALATION_SPRAY | RESPIRATORY_TRACT | Status: DC | PRN
  Administered 2023-02-25 – 2023-02-27 (×3): 2 via RESPIRATORY_TRACT
  Filled 2023-02-25: qty 6.7

## 2023-02-25 NOTE — Group Note (Signed)
Recreation Therapy Group Note   Group Topic:Problem Solving  Group Date: 02/25/2023 Start Time: 1000 End Time: 1110 Facilitators: Clinton Gallant, CTRS Location:  Craft Room  Group Description: Life Boat. Patients were given the scenario that they are on a boat that is about to become shipwrecked, leaving them stranded on an [place]. They are asked to make a list of 15 different items that they want to take with them when they are stranded on the [place]. Patients are asked to rank their items from most important to least important, #1 being the most important and #15 being the least. Patients will work individually for the first round to come up with 15 items and then pair up with a peer(s) to condense their list and come up with one list of 15 items between the two of them. Patients or LRT will read aloud the 15 different items to the group after each round. LRT facilitated post-activity processing to discuss how this activity can be used in daily life post discharge.   Goal Area(s) Addressed:  Patient will identify priorities, wants and needs. Patient will communicate with LRT and peers. Patient will work collectively as a Administrator, Civil Service. Patient will work on Product manager.    Affect/Mood: Appropriate   Participation Level: Active and Engaged   Participation Quality: Independent   Behavior: Calm and Cooperative   Speech/Thought Process: Coherent   Insight: Good   Judgement: Good   Modes of Intervention: Activity   Patient Response to Interventions:  Attentive, Engaged, Interested , and Receptive   Education Outcome:  Acknowledges education   Clinical Observations/Individualized Feedback: Monico Blitz was active in their participation of session activities and group discussion. Pt identified "matches, pocket knife, compass, rope" as some of the things he wants to bring with him on the [place]. Pt interacted well with LRT and peers duration of session.    Plan: Continue to  engage patient in RT group sessions 2-3x/week.   Rosina Lowenstein, LRT, CTRS 02/25/2023 11:58 AM

## 2023-02-25 NOTE — Plan of Care (Signed)
Patient stated that his depression getting better but anxiety is high. Patient had Vistaril x 1 with good result. Patient denies SI,HI and AVH. Visible in the milieu. Appropriate with staff & peers. Appetite and energy level good. Support and encouragement given.

## 2023-02-25 NOTE — Progress Notes (Signed)
[facility] MD Progress Note  02/25/2023 7:38 PM Anna [name]  MRN:  [id number] Subjective:  27 year old Caucasian transgender female who has completed a treatment team evaluation. He requests an "inhaler" for respiratory symptoms. He denies any current suicidal ideation (SI), homicidal ideation (HI), or self-injurious behavior (SIB).atient has requested an inhaler; pending further evaluation to determine the need and type (e.g., for asthma or shortness of breath).Patient is alert, oriented, and cooperative with the treatment team. He appears motivated for treatment and communicates his needs clearly. Principal Problem: Bipolar 1 disorder (HCC) Diagnosis: Principal Problem:   Bipolar 1 disorder (HCC)  Total Time spent with patient: 45 minutes  Past Psychiatric History: Anxiety, Depression  Past Medical History:  Past Medical History:  Diagnosis Date   Anxiety    Borderline personality disorder (HCC)    Depression    Depression    Phreesia 02/24/2020   Enlarged heart    Pneumonia    PTSD (post-traumatic stress disorder)     Past Surgical History:  Procedure Laterality Date   WISDOM TOOTH EXTRACTION     Family History:  Family History  Problem Relation Age of Onset   Clotting disorder Mother    Cancer Father    COPD Father        skin   Depression Father    Healthy Sister    Healthy Brother    Cancer Maternal Grandmother        breast   Colon cancer Paternal Grandmother    Family Psychiatric  History: see above Social History:  Social History   Substance and Sexual Activity  Alcohol Use Not Currently     Social History   Substance and Sexual Activity  Drug Use Not Currently   Comment: 2017 Marijuana    Social History   Socioeconomic History   Marital status: Single    Spouse name: Not on file   Number of children: 0   Years of education: Not on file   Highest education level: Not on file  Occupational History   Not on file  Tobacco Use   Smoking status:  Former    Current packs/day: 0.00    Average packs/day: 0.3 packs/day for 3.0 years (0.8 ttl pk-yrs)    Types: Cigarettes    Start date: 10/22/2014    Quit date: 10/21/2017    Years since quitting: 5.3   Smokeless tobacco: Never  Vaping Use   Vaping status: Every Day  Substance and Sexual Activity   Alcohol use: Not Currently   Drug use: Not Currently    Comment: 2017 Marijuana   Sexual activity: Not Currently    Partners: Male    Birth control/protection: Implant  Other Topics Concern   Not on file  Social History Narrative   Not on file   Social Determinants of Health   Financial Resource Strain: Not on file  Food Insecurity: Not on file  Transportation Needs: Patient Declined (02/23/2023)   PRAPARE - Administrator, Civil Service (Medical): Patient declined    Lack of Transportation (Non-Medical): Patient declined  Physical Activity: Not on file  Stress: Not on file  Social Connections: Not on file   Additional Social History:                         Sleep: Good  Appetite:  Good  Current Medications: Current Facility-Administered Medications  Medication Dose Route Frequency Provider Last Rate Last Admin   acetaminophen (TYLENOL) tablet 650 mg  650 mg Oral Q6H PRN Jearld Lesch, NP   650 mg at 02/25/23 0042   albuterol (VENTOLIN HFA) 108 (90 Base) MCG/ACT inhaler 2 puff  2 puff Inhalation Q4H PRN Myriam Forehand, NP   2 puff at 02/25/23 1759   alum & mag hydroxide-simeth (MAALOX/MYLANTA) 200-200-20 MG/5ML suspension 30 mL  30 mL Oral Q4H PRN Jearld Lesch, NP       ARIPiprazole (ABILIFY) tablet 2 mg  2 mg Oral QHS Dixon, Rashaun M, NP   2 mg at 02/24/23 2134   diphenhydrAMINE (BENADRYL) capsule 50 mg  50 mg Oral TID PRN Jearld Lesch, NP       Or   diphenhydrAMINE (BENADRYL) injection 50 mg  50 mg Intramuscular TID PRN Jearld Lesch, NP       haloperidol (HALDOL) tablet 5 mg  5 mg Oral TID PRN Jearld Lesch, NP       Or   haloperidol  lactate (HALDOL) injection 5 mg  5 mg Intramuscular TID PRN Jearld Lesch, NP       hydrOXYzine (ATARAX) tablet 25 mg  25 mg Oral TID PRN Jearld Lesch, NP   25 mg at 02/25/23 0957   magnesium hydroxide (MILK OF MAGNESIA) suspension 30 mL  30 mL Oral Daily PRN Jearld Lesch, NP       traZODone (DESYREL) tablet 50 mg  50 mg Oral QHS PRN Jearld Lesch, NP   50 mg at 02/25/23 0042   [START ON 02/26/2023] venlafaxine (EFFEXOR) tablet 50 mg  50 mg Oral BID WC Myriam Forehand, NP        Lab Results: No results found for this or any previous visit (from the past 48 hour(s)).  Blood Alcohol level:  Lab Results  Component Value Date   ETH <10 02/22/2023    Metabolic Disorder Labs: Lab Results  Component Value Date   HGBA1C 5.1 02/22/2023   MPG 99.67 02/22/2023   No results found for: "PROLACTIN" Lab Results  Component Value Date   CHOL 194 02/22/2023   TRIG 45 02/22/2023   HDL 55 02/22/2023   CHOLHDL 3.5 02/22/2023   VLDL 9 02/22/2023   LDLCALC 130 (H) 02/22/2023   LDLCALC 108 (H) 03/01/2020    Physical Findings: AIMS:  , ,  ,  ,    CIWA:    COWS:     Musculoskeletal: Strength & Muscle Tone: within normal limits Gait & Station: normal Patient leans: N/A  Psychiatric Specialty Exam:  Presentation  General Appearance:  Disheveled  Eye Contact: Good  Speech: Clear and Coherent; Normal Rate  Speech Volume: Normal  Handedness: Right   Mood and Affect  Mood: Anxious  Affect: Tearful; Appropriate   Thought Process  Thought Processes: Coherent  Descriptions of Associations:Intact  Orientation:Full (Time, Place and Person) (and situation)  Thought Content:WDL  History of Schizophrenia/Schizoaffective disorder:No  Duration of Psychotic Symptoms:N/A  Hallucinations:Hallucinations: None  Ideas of Reference:None  Suicidal Thoughts:Suicidal Thoughts: No SI Passive Intent and/or Plan: -- (denies)  Homicidal Thoughts:Homicidal Thoughts:  No   Sensorium  Memory: Immediate Fair; Remote Fair  Judgment: Fair  Insight: Fair   Executive Functions  Concentration: Good  Attention Span: Fair  Recall: Good  Fund of Knowledge: Good  Language: Good   Psychomotor Activity  Psychomotor Activity: Psychomotor Activity: Normal   Assets  Assets: Communication Skills; Desire for Improvement; Housing; Talents/Skills   Sleep  Sleep: Sleep: Fair Number of Hours of Sleep: 6  Physical Exam: Physical Exam ROS Blood pressure (!) 141/79, pulse 83, temperature 98.3 F (36.8 C), resp. rate 17, height 5\' 4"  (1.626 m), weight 127.9 kg, SpO2 99%. Body mass index is 48.41 kg/m.   Treatment Plan Summary: Daily contact with patient to assess and evaluate symptoms and progress in treatment and Medication management Continue Abilify 2 mg orally daily to support mood stabilization. Continue Trazodone 50 mg orally at bedtime to aid with sleep. Continue Hydroxyzine 25 mg orally three times daily as needed for anxiety management. Albuterol inhaler 2 puffs as needed  Myriam Forehand, NP 02/25/2023, 7:38 PM

## 2023-02-25 NOTE — Progress Notes (Signed)
This writer assumed care of patient at 1500. Patient is active on the unit and is interacting appropriately with others. Patient is anxious about letting their psychiatrist know they are in the hospital. Support and encouragement given. Patient also informed writer they have asthma and requests inhaler. Inhaler was given with relief.

## 2023-02-25 NOTE — BH IP Treatment Plan (Signed)
Interdisciplinary Treatment and Diagnostic Plan Update  02/25/2023 Time of Session: 9:23AM Anna [name] MRN: [id number]  Principal Diagnosis: Bipolar 1 disorder (HCC)  Secondary Diagnoses: Principal Problem:   Bipolar 1 disorder (HCC)   Current Medications:  Current Facility-Administered Medications  Medication Dose Route Frequency Provider Last Rate Last Admin   acetaminophen (TYLENOL) tablet 650 mg  650 mg Oral Q6H PRN Jearld Lesch, NP   650 mg at 02/25/23 0042   alum & mag hydroxide-simeth (MAALOX/MYLANTA) 200-200-20 MG/5ML suspension 30 mL  30 mL Oral Q4H PRN Jearld Lesch, NP       ARIPiprazole (ABILIFY) tablet 2 mg  2 mg Oral QHS Dixon, Rashaun M, NP   2 mg at 02/24/23 2134   diphenhydrAMINE (BENADRYL) capsule 50 mg  50 mg Oral TID PRN Jearld Lesch, NP       Or   diphenhydrAMINE (BENADRYL) injection 50 mg  50 mg Intramuscular TID PRN Jearld Lesch, NP       haloperidol (HALDOL) tablet 5 mg  5 mg Oral TID PRN Jearld Lesch, NP       Or   haloperidol lactate (HALDOL) injection 5 mg  5 mg Intramuscular TID PRN Jearld Lesch, NP       hydrOXYzine (ATARAX) tablet 25 mg  25 mg Oral TID PRN Jearld Lesch, NP   25 mg at 02/25/23 0957   magnesium hydroxide (MILK OF MAGNESIA) suspension 30 mL  30 mL Oral Daily PRN Jearld Lesch, NP       traZODone (DESYREL) tablet 50 mg  50 mg Oral QHS PRN Jearld Lesch, NP   50 mg at 02/25/23 0042   venlafaxine (EFFEXOR) tablet 37.5 mg  37.5 mg Oral BID WC Oneta Rack, NP   37.5 mg at 02/25/23 1884   PTA Medications: Medications Prior to Admission  Medication Sig Dispense Refill Last Dose   amphetamine-dextroamphetamine (ADDERALL XR) 20 MG 24 hr capsule Take 20 mg by mouth 2 (two) times daily.      ARIPiprazole (ABILIFY) 2 MG tablet Take 2 mg by mouth at bedtime.      levonorgestrel-ethinyl estradiol (ALESSE) 0.1-20 MG-MCG tablet Take 1 tablet by mouth daily. 28 tablet 3    traZODone (DESYREL) 50 MG tablet Take  50-150 mg by mouth at bedtime as needed.      venlafaxine XR (EFFEXOR-XR) 150 MG 24 hr capsule TAKE 1 CAPSULE BY MOUTH IN THE MORNING       Patient Stressors: Health problems   Medication change or noncompliance    Patient Strengths: Physical Health  Supportive family/friends   Treatment Modalities: Medication Management, Group therapy, Case management,  1 to 1 session with clinician, Psychoeducation, Recreational therapy.   Physician Treatment Plan for Primary Diagnosis: Bipolar 1 disorder (HCC) Long Term Goal(s): Improvement in symptoms so as ready for discharge   Short Term Goals: Ability to identify changes in lifestyle to reduce recurrence of condition will improve Ability to verbalize feelings will improve Ability to disclose and discuss suicidal ideas Ability to demonstrate self-control will improve Ability to identify and develop effective coping behaviors will improve Ability to maintain clinical measurements within normal limits will improve Compliance with prescribed medications will improve Ability to identify triggers associated with substance abuse/mental health issues will improve  Medication Management: Evaluate patient's response, side effects, and tolerance of medication regimen.  Therapeutic Interventions: 1 to 1 sessions, Unit Group sessions and Medication administration.  Evaluation of Outcomes: Not Met  Physician  Treatment Plan for Secondary Diagnosis: Principal Problem:   Bipolar 1 disorder (HCC)  Long Term Goal(s): Improvement in symptoms so as ready for discharge   Short Term Goals: Ability to identify changes in lifestyle to reduce recurrence of condition will improve Ability to verbalize feelings will improve Ability to disclose and discuss suicidal ideas Ability to demonstrate self-control will improve Ability to identify and develop effective coping behaviors will improve Ability to maintain clinical measurements within normal limits will  improve Compliance with prescribed medications will improve Ability to identify triggers associated with substance abuse/mental health issues will improve     Medication Management: Evaluate patient's response, side effects, and tolerance of medication regimen.  Therapeutic Interventions: 1 to 1 sessions, Unit Group sessions and Medication administration.  Evaluation of Outcomes: Not Met   RN Treatment Plan for Primary Diagnosis: Bipolar 1 disorder (HCC) Long Term Goal(s): Knowledge of disease and therapeutic regimen to maintain health will improve  Short Term Goals: Ability to verbalize frustration and anger appropriately will improve, Ability to demonstrate self-control, Ability to participate in decision making will improve, Ability to identify and develop effective coping behaviors will improve, and Compliance with prescribed medications will improve  Medication Management: RN will administer medications as ordered by provider, will assess and evaluate patient's response and provide education to patient for prescribed medication. RN will report any adverse and/or side effects to prescribing provider.  Therapeutic Interventions: 1 on 1 counseling sessions, Psychoeducation, Medication administration, Evaluate responses to treatment, Monitor vital signs and CBGs as ordered, Perform/monitor CIWA, COWS, AIMS and Fall Risk screenings as ordered, Perform wound care treatments as ordered.  Evaluation of Outcomes: Not Met   LCSW Treatment Plan for Primary Diagnosis: Bipolar 1 disorder (HCC) Long Term Goal(s): Safe transition to appropriate next level of care at discharge, Engage patient in therapeutic group addressing interpersonal concerns.  Short Term Goals: Engage patient in aftercare planning with referrals and resources, Increase social support, Increase ability to appropriately verbalize feelings, Increase emotional regulation, Facilitate acceptance of mental health diagnosis and concerns,  and Increase skills for wellness and recovery  Therapeutic Interventions: Assess for all discharge needs, 1 to 1 time with Social worker, Explore available resources and support systems, Assess for adequacy in community support network, Educate family and significant other(s) on suicide prevention, Complete Psychosocial Assessment, Interpersonal group therapy.  Evaluation of Outcomes: Not Met   Progress in Treatment: Attending groups: Yes. Participating in groups: Yes. Taking medication as prescribed: Yes. Toleration medication: Yes. Family/Significant other contact made: No, will contact:  CSW will contact once permission is granted.  Patient understands diagnosis: Yes. Discussing patient identified problems/goals with staff: Yes. Medical problems stabilized or resolved: Yes. Denies suicidal/homicidal ideation: No. Issues/concerns per patient self-inventory: Yes. and No. Other: None.   New problem(s) identified: No, Describe:  None  New Short Term/Long Term Goal(s): detox, elimination of symptoms of psychosis, medication management for mood stabilization; elimination of SI thoughts; development of comprehensive mental wellness/sobriety plan.    Patient Goals:  "I lost touch of reality and want to be able to see what is in my face."  Discharge Plan or Barriers: CSW to assist in development of appropriate discharge plans for pt.   Reason for Continuation of Hospitalization: Anxiety Depression  Estimated Length of Stay: 1-7 days.   Last 3 [place] Suicide Severity Risk Score: Flowsheet Row Admission (Current) from 02/23/2023 in [facility] ED from [facility]/22/2023 in [facility]  C-SSRS RISK CATEGORY Error: Q2 is Yes,  you must answer 3, 4, and 5 Low Risk       Last PHQ 2/9 Scores:    04/18/2021    1:22 PM 08/01/2020    9:25 AM 02/26/2020   10:42 AM  Depression screen PHQ 2/9  Decreased Interest 1 0 2  Down, Depressed, Hopeless 1  1 2   PHQ - 2 Score 2 1 4   Altered sleeping 3 2 3   Tired, decreased energy 3 0 1  Change in appetite 1 0 2  Feeling bad or failure about yourself  1 1 3   Trouble concentrating 1 2 2   Moving slowly or fidgety/restless 3 2 3   Suicidal thoughts 0 0 1  PHQ-9 Score 14 8 19   Difficult doing work/chores Somewhat difficult  Somewhat difficult    Scribe for Treatment Team: Lowry Ram, LCSW 02/25/2023 12:05 PM

## 2023-02-25 NOTE — Progress Notes (Signed)
Pt is alert and oriented X4. Affect  bright/mood congruent.SI/HI/AVH at this time.  Scheduled medications administration to patient per MD orders. Support and encouragement provided. Routine safety checks conducted every 15 minutes without incident. Patient informed to notify staff with problems or concerns and verbalize understanding.   No adverse drug reactions noted. Compliant with medications and treatment plan . Pt receptive , calm , cooperative and interacts well with others on the unit. Pt contracts for safety  and remains safe on the unit at this time. Marland Kitchen

## 2023-02-25 NOTE — Group Note (Signed)
Date:  02/25/2023 Time:  6:41 PM  Group Topic/Focus:  OUTDOOR RECREATION STRUCTURED ACTIVITY    Participation Level:  Active  Participation Quality:  Appropriate  Affect:  Appropriate  Cognitive:  Appropriate  Insight: Appropriate  Engagement in Group:  Developing/Improving  Modes of Intervention:  Activity  Additional Comments:    Anna [name] Anna [name] 02/25/2023, 6:41 PM

## 2023-02-25 NOTE — Group Note (Signed)
Date:  02/25/2023 Time:  10:01 PM  Group Topic/Focus:  Overcoming Stress:   The focus of this group is to define stress and help patients assess their triggers.    Participation Level:  Active  Participation Quality:  Appropriate and Attentive  Affect:  Appropriate  Cognitive:  Alert and Appropriate  Insight: Appropriate, Good, and Improving  Engagement in Group:  Developing/Improving and Engaged  Modes of Intervention:  Activity, Discussion, Education, Rapport Building, Socialization, and Support  Additional Comments:     Anna [name] 02/25/2023, 10:01 PM

## 2023-02-26 DIAGNOSIS — F322 Major depressive disorder, single episode, severe without psychotic features: Principal | ICD-10-CM

## 2023-02-26 MED ORDER — TRAZODONE HCL 100 MG PO TABS
100.0000 mg | ORAL_TABLET | Freq: Every day | ORAL | Status: DC
  Administered 2023-02-26 – 2023-02-28 (×3): 100 mg via ORAL
  Filled 2023-02-26 (×3): qty 1

## 2023-02-26 MED ORDER — GABAPENTIN 300 MG PO CAPS
300.0000 mg | ORAL_CAPSULE | Freq: Three times a day (TID) | ORAL | Status: DC
  Administered 2023-02-26 – 2023-03-01 (×9): 300 mg via ORAL
  Filled 2023-02-26 (×9): qty 1

## 2023-02-26 NOTE — Progress Notes (Signed)
[facility] MD Progress Note  02/26/2023 11:58 AM Anna [name]  MRN:  [id number]   Subjective:   Notes, vital signs, and labs reviewed.  Anna [name] was socializing most of the day in the day room appropriately and attending actively in groups.  Today he denies depression, "none there".  His anxiety is high with some decrease with the hydroxyzine.  "I'm more anxious than I thought".  Appetite is "fair".  Sleep initiation is poor, maintenance is good.  He reported being easily frustrated and ruminates about his financial issues.  His insurance has lapsed which has decreased his use of services of EMDR and DBT.  He desires a DBT therapist in-person versus his online provider.  He is also seeking Medicaid and disability, "I've got borderline personality disorder, who's going to let me work".  Discussed medications and managed adjustments noted below.  Principal Problem:  Major depressive disorder, recurrent, severe without psychosis Diagnosis:  Major depressive disorder, recurrent, severe without psychosis  Total Time spent with patient: 30 minutes  Past Psychiatric History: BPD, anxiety, depression, PTSD, ADHD  Past Medical History:  Past Medical History:  Diagnosis Date   Anxiety    Borderline personality disorder (HCC)    Depression    Depression    Phreesia 02/24/2020   Enlarged heart    Pneumonia    PTSD (post-traumatic stress disorder)     Past Surgical History:  Procedure Laterality Date   WISDOM TOOTH EXTRACTION     Family History:  Family History  Problem Relation Age of Onset   Clotting disorder Mother    Cancer Father    COPD Father        skin   Depression Father    Healthy Sister    Healthy Brother    Cancer Maternal Grandmother        breast   Colon cancer Paternal Grandmother    Family Psychiatric  History: see above Social History:  Social History   Substance and Sexual Activity  Alcohol Use Not Currently     Social History   Substance and Sexual Activity   Drug Use Not Currently   Comment: 2017 Marijuana    Social History   Socioeconomic History   Marital status: Single    Spouse name: Not on file   Number of children: 0   Years of education: Not on file   Highest education level: Not on file  Occupational History   Not on file  Tobacco Use   Smoking status: Former    Current packs/day: 0.00    Average packs/day: 0.3 packs/day for 3.0 years (0.8 ttl pk-yrs)    Types: Cigarettes    Start date: 10/22/2014    Quit date: 10/21/2017    Years since quitting: 5.3   Smokeless tobacco: Never  Vaping Use   Vaping status: Every Day  Substance and Sexual Activity   Alcohol use: Not Currently   Drug use: Not Currently    Comment: 2017 Marijuana   Sexual activity: Not Currently    Partners: Male    Birth control/protection: Implant  Other Topics Concern   Not on file  Social History Narrative   Not on file   Social Determinants of Health   Financial Resource Strain: Not on file  Food Insecurity: Not on file  Transportation Needs: Patient Declined (02/23/2023)   PRAPARE - Administrator, Civil Service (Medical): Patient declined    Lack of Transportation (Non-Medical): Patient declined  Physical Activity: Not on file  Stress:  Not on file  Social Connections: Not on file   Additional Social History: lives with parents     Sleep: Fair  Appetite:  Fair  Current Medications: Current Facility-Administered Medications  Medication Dose Route Frequency Provider Last Rate Last Admin   acetaminophen (TYLENOL) tablet 650 mg  650 mg Oral Q6H PRN Jearld Lesch, NP   650 mg at 02/25/23 2120   albuterol (VENTOLIN HFA) 108 (90 Base) MCG/ACT inhaler 2 puff  2 puff Inhalation Q4H PRN Myriam Forehand, NP   2 puff at 02/25/23 1759   alum & mag hydroxide-simeth (MAALOX/MYLANTA) 200-200-20 MG/5ML suspension 30 mL  30 mL Oral Q4H PRN Jearld Lesch, NP       ARIPiprazole (ABILIFY) tablet 2 mg  2 mg Oral QHS Dixon, Rashaun M, NP   2 mg  at 02/25/23 2119   diphenhydrAMINE (BENADRYL) capsule 50 mg  50 mg Oral TID PRN Jearld Lesch, NP       Or   diphenhydrAMINE (BENADRYL) injection 50 mg  50 mg Intramuscular TID PRN Jearld Lesch, NP       haloperidol (HALDOL) tablet 5 mg  5 mg Oral TID PRN Jearld Lesch, NP       Or   haloperidol lactate (HALDOL) injection 5 mg  5 mg Intramuscular TID PRN Jearld Lesch, NP       hydrOXYzine (ATARAX) tablet 25 mg  25 mg Oral TID PRN Jearld Lesch, NP   25 mg at 02/26/23 0948   magnesium hydroxide (MILK OF MAGNESIA) suspension 30 mL  30 mL Oral Daily PRN Jearld Lesch, NP       traZODone (DESYREL) tablet 50 mg  50 mg Oral QHS PRN Jearld Lesch, NP   50 mg at 02/25/23 0042   venlafaxine (EFFEXOR) tablet 50 mg  50 mg Oral BID WC Myriam Forehand, NP   50 mg at 02/26/23 0813    Lab Results: No results found for this or any previous visit (from the past 48 hour(s)).  Blood Alcohol level:  Lab Results  Component Value Date   ETH <10 02/22/2023    Metabolic Disorder Labs: Lab Results  Component Value Date   HGBA1C 5.1 02/22/2023   MPG 99.67 02/22/2023   No results found for: "PROLACTIN" Lab Results  Component Value Date   CHOL 194 02/22/2023   TRIG 45 02/22/2023   HDL 55 02/22/2023   CHOLHDL 3.5 02/22/2023   VLDL 9 02/22/2023   LDLCALC 130 (H) 02/22/2023   LDLCALC 108 (H) 03/01/2020    Physical Findings: AIMS:  , ,  ,  ,    CIWA:    COWS:     Musculoskeletal: Strength & Muscle Tone: within normal limits Gait & Station: normal Patient leans: N/A  Psychiatric Specialty Exam: Physical Exam Vitals and nursing note reviewed.  Constitutional:      Appearance: Normal appearance.  HENT:     Head: Normocephalic.     Nose: Nose normal.  Pulmonary:     Effort: Pulmonary effort is normal.  Musculoskeletal:        General: Normal range of motion.     Cervical back: Normal range of motion.  Neurological:     General: No focal deficit present.     Mental  Status: He is alert and oriented to person, place, and time.     Review of Systems  Psychiatric/Behavioral:  The patient is nervous/anxious.   All other systems reviewed and  are negative.   Blood pressure 120/73, pulse 79, temperature 98.7 F (37.1 C), resp. rate (!) 21, height 5\' 4"  (1.626 m), weight 127.9 kg, SpO2 100%.Body mass index is 48.41 kg/m.  General Appearance: Casual  Eye Contact:  Good  Speech:  Normal Rate  Volume:  Normal  Mood:  Anxious  Affect:  Congruent  Thought Process:  Coherent  Orientation:  Full (Time, Place, and Person)  Thought Content:  Rumination  Suicidal Thoughts:  No  Homicidal Thoughts:  No  Memory:  Immediate;   Good Recent;   Good Remote;   Good  Judgement:  Fair  Insight:  Fair  Psychomotor Activity:  Normal  Concentration:  Concentration: Good and Attention Span: Good  Recall:  Good  Fund of Knowledge:  Good  Language:  Good  Akathisia:  No  Handed:  Right  AIMS (if indicated):     Assets:  Housing Leisure Time Physical Health Resilience Social Support  ADL's:  Intact  Cognition:  WNL  Sleep:         Physical Exam: Physical Exam Vitals and nursing note reviewed.  Constitutional:      Appearance: Normal appearance.  HENT:     Head: Normocephalic.     Nose: Nose normal.  Pulmonary:     Effort: Pulmonary effort is normal.  Musculoskeletal:        General: Normal range of motion.     Cervical back: Normal range of motion.  Neurological:     General: No focal deficit present.     Mental Status: He is alert and oriented to person, place, and time.    Review of Systems  Psychiatric/Behavioral:  The patient is nervous/anxious.   All other systems reviewed and are negative.  Blood pressure 120/73, pulse 79, temperature 98.7 F (37.1 C), resp. rate (!) 21, height 5\' 4"  (1.626 m), weight 127.9 kg, SpO2 100%. Body mass index is 48.41 kg/m.   Treatment Plan Summary: Daily contact with patient to assess and evaluate  symptoms and progress in treatment, Medication management, and Plan :  Major depressive disorder, recurrent, severe without psychosis Effexor 50 mg BID Abilify 2 mg daily  Anxiety Discontinued hydroxyzine 25 mg TID PRN Started gabapentin 100 mg TID  Insomnia: Increased Trazodone 50 mg daily at bedtime to 100 mg at bedtime  Nanine Means, NP 02/26/2023, 11:58 AM

## 2023-02-26 NOTE — Progress Notes (Signed)
Patient pleasant and cooperative. Denies SI, HI, AVH. Reports anxiety on unit at times because of the other patients here. Patient reports using coping skills to help herself and others.  Encouragement and support provided. Safety checks maintained. Medications given as prescribed. Pt receptive and remains safe on unit with q 15 min checks.

## 2023-02-26 NOTE — Group Note (Signed)
Date:  02/26/2023 Time:  11:23 AM  Group Topic/Focus:  Coping With Mental Health Crisis:   The purpose of this group is to help patients identify strategies for coping with mental health crisis.  Group discusses possible causes of crisis and ways to manage them effectively. Goals Group:   The focus of this group is to help patients establish daily goals to achieve during treatment and discuss how the patient can incorporate goal setting into their daily lives to aide in recovery. Rediscovering Joy:   The focus of this group is to explore various ways to relieve stress in a positive manner.    Participation Level:  Active  Participation Quality:  Appropriate  Affect:  Appropriate  Cognitive:  Alert, Appropriate, and Oriented  Insight: Appropriate  Engagement in Group:  Developing/Improving and Engaged  Modes of Intervention:  Activity  Additional Comments:    Fontaine Kossman 02/26/2023, 11:23 AM

## 2023-02-26 NOTE — Progress Notes (Signed)
Patient denies SI, HI, and AVH. He reports some anxiety and requests hydroxyzine twice today. He is compliant with scheduled medications. He is active on the unit and is observed interacting appropriately with staff and other patients.

## 2023-02-26 NOTE — Plan of Care (Signed)
  Problem: Education: Goal: Emotional status will improve Outcome: Progressing Goal: Mental status will improve Outcome: Progressing Goal: Verbalization of understanding the information provided will improve Outcome: Progressing   Problem: Coping: Goal: Ability to verbalize frustrations and anger appropriately will improve Outcome: Progressing Goal: Ability to demonstrate self-control will improve Outcome: Progressing   Problem: Safety: Goal: Periods of time without injury will increase Outcome: Progressing

## 2023-02-26 NOTE — Group Note (Signed)
Recreation Therapy Group Note   Group Topic:Health and Wellness  Group Date: 02/26/2023 Start Time: 1000 End Time: 1055 Facilitators: Rosina Lowenstein, LRT, CTRS Location: Courtyard  Group Description: [employer]. LRT and patients played games of basketball, drew with chalk, and played corn hole while outside in the courtyard while getting fresh air and sunlight. Music was being played in the background. LRT and peers conversed about different games they have played before, what they do in their free time and anything else that is on their minds. LRT encouraged pts to drink water after being outside, sweating and getting their heart rate up.  Goal Area(s) Addressed: Patient will build on frustration tolerance skills. Patients will partake in a competitive play game with peers. Patients will gain knowledge of new leisure interest/hobby.   Affect/Mood: Appropriate   Participation Level: Active and Engaged   Participation Quality: Independent   Behavior: Calm and Cooperative   Speech/Thought Process: Coherent   Insight: Good   Judgement: Good   Modes of Intervention: Activity   Patient Response to Interventions:  Attentive, Engaged, Interested , and Receptive   Education Outcome:  Acknowledges education   Clinical Observations/Individualized Feedback: Anna [name] was active in their participation of session activities and group discussion. Pt chose to throw the basketball with peers while in group. Pt interacted well with LRT and peers duration of session.    Plan: Continue to engage patient in RT group sessions 2-3x/week.   Rosina Lowenstein, LRT, CTRS 02/26/2023 11:47 AM

## 2023-02-26 NOTE — Group Note (Signed)
Date:  02/26/2023 Time:  9:59 PM  Group Topic/Focus:  Overcoming Stress:   The focus of this group is to define stress and help patients assess their triggers.    Participation Level:  Active  Participation Quality:  Appropriate and Attentive  Affect:  Appropriate  Cognitive:  Alert and Appropriate  Insight: Appropriate, Good, and Improving  Engagement in Group:  Developing/Improving and Engaged  Modes of Intervention:  Clarification, Discussion, Education, Problem-solving, Rapport Building, Socialization, and Support  Additional Comments:     Anna [name] 02/26/2023, 9:59 PM

## 2023-02-26 NOTE — Progress Notes (Signed)
D: Pt alert and oriented. Pt denies experiencing depression, however, rates anxiety 3/10 at time of assessment. Pt rates headache pain 6/10. Pt denies experiencing any SI/HI, or AVH.   A: Scheduled medications administered to pt, per MD orders. Tylenol 650 mg po prn and vistaril 25 mg po prn offered for headache pain 6/10 and anxiety 3/10 respectively. Support and encouragement provided. Frequent verbal contact made. Routine safety checks conducted q15 minutes.   R: No adverse drug reactions noted. Offered prn reported as effective at reassessment. Pt verbally contracts for safety at this time. Pt complaint with medications. Pt interacts appropriately with others on the unit. Pt remains safe at this time.

## 2023-02-27 DIAGNOSIS — F322 Major depressive disorder, single episode, severe without psychotic features: Secondary | ICD-10-CM | POA: Diagnosis not present

## 2023-02-27 LAB — HEMOGLOBIN A1C
Hgb A1c MFr Bld: 4.9 % (ref 4.8–5.6)
Mean Plasma Glucose: 93.93 mg/dL

## 2023-02-27 LAB — LIPID PANEL
Cholesterol: 165 mg/dL (ref 0–200)
HDL: 44 mg/dL (ref >40)
LDL Cholesterol: 106 mg/dL — ABNORMAL HIGH (ref 0–99)
Total CHOL/HDL Ratio: 3.8 {ratio}
Triglycerides: 74 mg/dL (ref <150)
VLDL: 15 mg/dL (ref 0–40)

## 2023-02-27 LAB — GLUCOSE, CAPILLARY: Glucose-Capillary: 88 mg/dL (ref 70–99)

## 2023-02-27 NOTE — Progress Notes (Signed)
   02/27/23 1500  Psych Admission Type (Psych Patients Only)  Admission Status Voluntary  Psychosocial Assessment  Patient Complaints Anxiety  Eye Contact Fair  Facial Expression Anxious  Affect Anxious  Speech Logical/coherent  Interaction Assertive  Motor Activity Slow  Appearance/Hygiene Unremarkable  Behavior Characteristics Cooperative;Appropriate to situation  Mood Anxious;Pleasant  Thought Process  Coherency WDL  Content WDL  Delusions None reported or observed  Perception WDL  Hallucination None reported or observed  Judgment Impaired  Confusion None  Danger to Self  Current suicidal ideation? Denies

## 2023-02-27 NOTE — Group Note (Signed)
Recreation Therapy Group Note   Group Topic:Goal Setting  Group Date: 02/27/2023 Start Time: 1015 End Time: 1115 Facilitators: Rosina Lowenstein, LRT, CTRS Location:  Dayroom  Group Description: Vision Boards. Patients were given many different magazines, a glue stick, markers, and a piece of cardstock paper. LRT and pts discussed the importance of having goals in life. LRT and pts discussed the difference between short-term and long-term goals, as well as what a SMART goal is. LRT encouraged pts to create a vision board, with images they picked and then cut out with safety scissors from the magazine, for themselves, that capture their short and long-term goals. LRT encouraged pts to show and explain their vision board to the group.   Goal Area(s) Addressed:  Patient will gain knowledge of short vs. long term goals.  Patient will identify goals for themselves. Patient will practice setting SMART goals. Patient will verbalize their goals to LRT and peers.   Affect/Mood: Appropriate   Participation Level: Active and Engaged   Participation Quality: Independent   Behavior: Appropriate, Calm, and Cooperative   Speech/Thought Process: Coherent   Insight: Good   Judgement: Good   Modes of Intervention: Art   Patient Response to Interventions:  Attentive, Engaged, Interested , and Receptive   Education Outcome:  Acknowledges education   Clinical Observations/Individualized Feedback: Monico Blitz was active in their participation of session activities and group discussion. Pt identified "I want to be more connected with my family, I want to leave the past in the past, and I want to lose weight" as his goals. Pt interacted well with LRT and peers duration of session.    Plan: Continue to engage patient in RT group sessions 2-3x/week.   Rosina Lowenstein, LRT, CTRS 02/27/2023 11:44 AM

## 2023-02-27 NOTE — Group Note (Signed)
Date:  02/27/2023 Time:  10:03 PM  Group Topic/Focus:  Orientation:   The focus of this group is to educate the patient on the purpose and policies of crisis stabilization and provide a format to answer questions about their admission.  The group details unit policies and expectations of patients while admitted.    Participation Level:  Active  Participation Quality:  Appropriate and Attentive  Affect:  Appropriate  Cognitive:  Alert and Appropriate  Insight: Appropriate, Good, and Improving  Engagement in Group:  Developing/Improving and Engaged  Modes of Intervention:  Clarification, Discussion, Education, Orientation, and Support  Additional Comments:     Carlester Kasparek 02/27/2023, 10:03 PM

## 2023-02-27 NOTE — Group Note (Signed)
Date:  02/27/2023 Time:  10:28 AM  Group Topic/Focus:  Activity Group:  The focus of the group is to encourage patients to come outside to the courtyard to get some fresh air and exercise for the benefit of their mental health,    Participation Level:  Active  Participation Quality:  Appropriate  Affect:  Appropriate  Cognitive:  Appropriate  Insight: Appropriate  Engagement in Group:  Engaged  Modes of Intervention:  Activity  Additional Comments:    Anna [name] 02/27/2023, 10:28 AM

## 2023-02-27 NOTE — Plan of Care (Signed)
  Problem: Education: Goal: Knowledge of [facility] Education information/materials will improve Outcome: Progressing Goal: Emotional status will improve Outcome: Progressing   

## 2023-02-27 NOTE — Progress Notes (Signed)
[facility] MD Progress Note  02/27/2023 7:52 AM Anna [name]  MRN:  [id number]   Subjective:   Notes, vital signs, and labs reviewed.  [name] was socializing outside prior to the assessment.  Regarding depression, "I don't feel depressed, a little anxious".  No panic attacks.  Her sleep was good except awakening early.  Appetite without issues.  Denies side effects from her medications.  She plans to walk her dog after discharge to get exercise.  Discharge planned for Friday.  Principal Problem:  Major depressive disorder, recurrent, severe without psychosis Diagnosis:  Major depressive disorder, recurrent, severe without psychosis  Total Time spent with patient: 30 minutes  Past Psychiatric History: BPD, anxiety, depression, PTSD, ADHD  Past Medical History:  Past Medical History:  Diagnosis Date   Anxiety    Borderline personality disorder (HCC)    Depression    Depression    Phreesia 02/24/2020   Enlarged heart    Pneumonia    PTSD (post-traumatic stress disorder)     Past Surgical History:  Procedure Laterality Date   WISDOM TOOTH EXTRACTION     Family History:  Family History  Problem Relation Age of Onset   Clotting disorder Mother    Cancer Father    COPD Father        skin   Depression Father    Healthy Sister    Healthy Brother    Cancer Maternal Grandmother        breast   Colon cancer Paternal Grandmother    Family Psychiatric  History: see above Social History:  Social History   Substance and Sexual Activity  Alcohol Use Not Currently     Social History   Substance and Sexual Activity  Drug Use Not Currently   Comment: 2017 Marijuana    Social History   Socioeconomic History   Marital status: Single    Spouse name: Not on file   Number of children: 0   Years of education: Not on file   Highest education level: Not on file  Occupational History   Not on file  Tobacco Use   Smoking status: Former    Current packs/day: 0.00    Average  packs/day: 0.3 packs/day for 3.0 years (0.8 ttl pk-yrs)    Types: Cigarettes    Start date: 10/22/2014    Quit date: 10/21/2017    Years since quitting: 5.3   Smokeless tobacco: Never  Vaping Use   Vaping status: Every Day  Substance and Sexual Activity   Alcohol use: Not Currently   Drug use: Not Currently    Comment: 2017 Marijuana   Sexual activity: Not Currently    Partners: Male    Birth control/protection: Implant  Other Topics Concern   Not on file  Social History Narrative   Not on file   Social Determinants of Health   Financial Resource Strain: Not on file  Food Insecurity: Not on file  Transportation Needs: Patient Declined (02/23/2023)   PRAPARE - Administrator, Civil Service (Medical): Patient declined    Lack of Transportation (Non-Medical): Patient declined  Physical Activity: Not on file  Stress: Not on file  Social Connections: Not on file   Additional Social History: lives with parents     Sleep: Fair  Appetite:  Fair  Current Medications: Current Facility-Administered Medications  Medication Dose Route Frequency Provider Last Rate Last Admin   acetaminophen (TYLENOL) tablet 650 mg  650 mg Oral Q6H PRN Jearld Lesch, NP  650 mg at 02/27/23 0626   albuterol (VENTOLIN HFA) 108 (90 Base) MCG/ACT inhaler 2 puff  2 puff Inhalation Q4H PRN Myriam Forehand, NP   2 puff at 02/26/23 1709   alum & mag hydroxide-simeth (MAALOX/MYLANTA) 200-200-20 MG/5ML suspension 30 mL  30 mL Oral Q4H PRN Jearld Lesch, NP       ARIPiprazole (ABILIFY) tablet 2 mg  2 mg Oral QHS Dixon, Rashaun M, NP   2 mg at 02/26/23 2124   diphenhydrAMINE (BENADRYL) capsule 50 mg  50 mg Oral TID PRN Jearld Lesch, NP       Or   diphenhydrAMINE (BENADRYL) injection 50 mg  50 mg Intramuscular TID PRN Jearld Lesch, NP       gabapentin (NEURONTIN) capsule 300 mg  300 mg Oral TID Charm Rings, NP   300 mg at 02/26/23 1709   haloperidol (HALDOL) tablet 5 mg  5 mg Oral TID PRN  Jearld Lesch, NP       Or   haloperidol lactate (HALDOL) injection 5 mg  5 mg Intramuscular TID PRN Jearld Lesch, NP       magnesium hydroxide (MILK OF MAGNESIA) suspension 30 mL  30 mL Oral Daily PRN Jearld Lesch, NP       traZODone (DESYREL) tablet 100 mg  100 mg Oral QHS Charm Rings, NP   100 mg at 02/26/23 2124   venlafaxine (EFFEXOR) tablet 50 mg  50 mg Oral BID WC Myriam Forehand, NP   50 mg at 02/26/23 1708    Lab Results:  Results for orders placed or performed during the hospital encounter of 02/23/23 (from the past 48 hour(s))  Lipid panel     Status: Abnormal   Collection Time: 02/27/23  6:10 AM  Result Value Ref Range   Cholesterol 165 0 - 200 mg/dL   Triglycerides 74 <478 mg/dL   HDL 44 >29 mg/dL   Total CHOL/HDL Ratio 3.8 RATIO   VLDL 15 0 - 40 mg/dL   LDL Cholesterol 562 (H) 0 - 99 mg/dL    Comment:        Total Cholesterol/HDL:CHD Risk Coronary Heart Disease Risk Table                     Men   Women  1/2 Average Risk   3.4   3.3  Average Risk       5.0   4.4  2 X Average Risk   9.6   7.1  3 X Average Risk  23.4   11.0        Use the calculated Patient Ratio above and the CHD Risk Table to determine the patient's CHD Risk.        ATP III CLASSIFICATION (LDL):  <100     mg/dL   Optimal  130-865  mg/dL   Near or Above                    Optimal  130-159  mg/dL   Borderline  784-696  mg/dL   High  >295     mg/dL   Very High Performed at [facility], [address] [place], [place] [address]     Blood Alcohol level:  Lab Results  Component Value Date   [facility] <10 02/22/2023    Metabolic Disorder Labs: Lab Results  Component Value Date   HGBA1C 5.1 02/22/2023   MPG 99.67 02/22/2023  No results found for: "PROLACTIN" Lab Results  Component Value Date   CHOL 165 02/27/2023   TRIG 74 02/27/2023   HDL 44 02/27/2023   CHOLHDL 3.8 02/27/2023   VLDL 15 02/27/2023   LDLCALC 106 (H) 02/27/2023   LDLCALC 130 (H) 02/22/2023     Physical Findings: AIMS:  , ,  ,  ,    CIWA:    COWS:     Musculoskeletal: Strength & Muscle Tone: within normal limits Gait & Station: normal Patient leans: N/A  Psychiatric Specialty Exam: Physical Exam Vitals and nursing note reviewed.  Constitutional:      Appearance: Normal appearance.  HENT:     Head: Normocephalic.     Nose: Nose normal.  Pulmonary:     Effort: Pulmonary effort is normal.  Musculoskeletal:        General: Normal range of motion.     Cervical back: Normal range of motion.  Neurological:     General: No focal deficit present.     Mental Status: He is alert and oriented to person, place, and time.     Review of Systems  Psychiatric/Behavioral:  The patient is nervous/anxious.   All other systems reviewed and are negative.   Blood pressure 124/68, pulse 74, temperature 97.8 F (36.6 C), resp. rate 19, height 5\' 4"  (1.626 m), weight 127.9 kg, SpO2 100%.Body mass index is 48.41 kg/m.  General Appearance: Casual  Eye Contact:  Good  Speech:  Normal Rate  Volume:  Normal  Mood:  Anxious  Affect:  Congruent  Thought Process:  Coherent  Orientation:  Full (Time, Place, and Person)  Thought Content:  WDL  Suicidal Thoughts:  No  Homicidal Thoughts:  No  Memory:  Immediate;   Good Recent;   Good Remote;   Good  Judgement:  Fair  Insight:  Fair  Psychomotor Activity:  Normal  Concentration:  Concentration: Good and Attention Span: Good  Recall:  Good  Fund of Knowledge:  Good  Language:  Good  Akathisia:  No  Handed:  Right  AIMS (if indicated):     Assets:  Housing Leisure Time Physical Health Resilience Social Support  ADL's:  Intact  Cognition:  WNL  Sleep:         Physical Exam: Physical Exam Vitals and nursing note reviewed.  Constitutional:      Appearance: Normal appearance.  HENT:     Head: Normocephalic.     Nose: Nose normal.  Pulmonary:     Effort: Pulmonary effort is normal.  Musculoskeletal:        General:  Normal range of motion.     Cervical back: Normal range of motion.  Neurological:     General: No focal deficit present.     Mental Status: He is alert and oriented to person, place, and time.    Review of Systems  Psychiatric/Behavioral:  The patient is nervous/anxious.   All other systems reviewed and are negative.  Blood pressure 124/68, pulse 74, temperature 97.8 F (36.6 C), resp. rate 19, height 5\' 4"  (1.626 m), weight 127.9 kg, SpO2 100%. Body mass index is 48.41 kg/m.   Treatment Plan Summary: Daily contact with patient to assess and evaluate symptoms and progress in treatment, Medication management, and Plan : Major depressive disorder, recurrent, severe without psychosis Effexor 50 mg BID Abilify 2 mg daily  Anxiety Gabapentin 100 mg TID  Insomnia: Trazodone 100 mg at bedtime  Nanine Means, NP 02/27/2023, 7:52 AM

## 2023-02-27 NOTE — Group Note (Signed)
Date:  02/27/2023 Time:  6:20 PM  Group Topic/Focus:  Wellness Toolbox:   The focus of this group is to discuss various aspects of wellness, balancing those aspects and exploring ways to increase the ability to experience wellness.  Patients will create a wellness toolbox for use upon discharge.    Participation Level:  Active  Participation Quality:  Appropriate  Affect:  Appropriate  Cognitive:  Appropriate  Insight: Appropriate  Engagement in Group:  Engaged  Modes of Intervention:  Activity and Education  Additional Comments:    Wilford Corner 02/27/2023, 6:20 PM

## 2023-02-28 DIAGNOSIS — F322 Major depressive disorder, single episode, severe without psychotic features: Secondary | ICD-10-CM | POA: Diagnosis not present

## 2023-02-28 NOTE — Group Note (Signed)
Recreation Therapy Group Note   Group Topic:Relaxation  Group Date: 02/28/2023 Start Time: 1005 End Time: 1055 Facilitators: Rosina Lowenstein, LRT, CTRS Location:  Craft Room  Group Description: PMR (Progressive Muscle Relaxation). LRT asks patients their current level of stress/anxiety from 1-10, with 10 being the highest. LRT educates patients on what PMR is and the benefits that come from it. Patients are asked to sit with their feet flat on the floor while sitting up and all the way back in their chair, if possible. LRT and pts follow a prompt through a speaker that requires you to tense and release different muscles in their body and focus on their breathing. During session, lights are off and soft music is being played. Pts are given a stress ball to use if needed. At the end of the prompt, LRT asks patients to rank their current levels of stress/anxiety from 1-10, 10 being the highest. LRT provides patients with an education handout on PMR.   Goal Area(s) Addressed:  Patients will be able to describe progressive muscle relaxation.  Patient will practice using relaxation technique. Patient will identify a new coping skill.  Patient will follow multistep directions to reduce anxiety and stress.   Affect/Mood: Appropriate   Participation Level: Active and Engaged   Participation Quality: Independent   Behavior: Calm and Cooperative   Speech/Thought Process: Coherent   Insight: Good   Judgement: Good   Modes of Intervention: Activity   Patient Response to Interventions:  Attentive, Engaged, Interested , and Receptive   Education Outcome:  Acknowledges education   Clinical Observations/Individualized Feedback: Anna [name] was active in their participation of session activities and group discussion. Pt identified that his anxiety was an 8 and stress was a 2 before the session. Afterwards, he shared that his anxiety was a 8 and stress was a 2. Pt shared: "don't have my ADHD meds so  anxiety is still high, they play off each other." Pt interacted well with LRT and peers duration of session.    Plan: Continue to engage patient in RT group sessions 2-3x/week.   Rosina Lowenstein, LRT, CTRS 02/28/2023 11:31 AM

## 2023-02-28 NOTE — [facility] Counselor (Signed)
CSW contacted the patient's mother at the patient's request.  Mother had questions on disability and Medicaid application.  CSW explained that the patient would need to follow up with the disability claims with the SSI office.  CSW explained that several emails have been sent to the financial counselors within the hospital for an update on the [facility] application. CSW informed that at this time there has been no response and patient would likely need to follow up with the Department of Social Services.  Conversation was terminated abruptly when this Clinical research associate corrected herself when using an incorrect pronoun, this Clinical research associate stated "she" and not patient's preferred "he" and apologized.  Call was terminated at this time.  Calls made by this clinician to reconnect were not answered.  HIPAA compliant voicemail was left.  Penni Homans, MSW, LCSW 02/28/2023 3:54 PM

## 2023-02-28 NOTE — Group Note (Unsigned)
Date:  02/28/2023 Time:  9:22 PM  Group Topic/Focus:  Wrap-Up Group:   The focus of this group is to help patients review their daily goal of treatment and discuss progress on daily workbooks.     Participation Level:  {B[facility] PARTICIPATION WUJWJ:19147}  Participation Quality:  {B[facility] PARTICIPATION QUALITY:22265}  Affect:  {B[facility] AFFECT:22266}  Cognitive:  {B[facility] COGNITIVE:22267}  Insight: {B[facility] Insight2:20797}  Engagement in Group:  {B[facility] ENGAGEMENT IN WGNFA:21308}  Modes of Intervention:  {B[facility] MODES OF INTERVENTION:22269}  Additional Comments:  ***  Belva Crome 02/28/2023, 9:22 PM

## 2023-02-28 NOTE — Group Note (Signed)
Date:  02/28/2023 Time:  10:25 AM  Group Topic/Focus:  Goals Group:   The focus of this group is to help patients establish daily goals to achieve during treatment and discuss how the patient can incorporate goal setting into their daily lives to aide in recovery.    Participation Level:  Active  Participation Quality:  Appropriate  Affect:  Appropriate  Cognitive:  Appropriate  Insight: Appropriate  Engagement in Group:  Engaged  Modes of Intervention:  Discussion, Education, and Support  Additional Comments:    Wilford Corner 02/28/2023, 10:25 AM

## 2023-02-28 NOTE — Progress Notes (Signed)
Patient had episode stating he felt everything but felt nothing. Sat in chair in hallway shaking. VS checked and were stable blood sugar WNL. Staff walked with patient to room to rest, patient back up shortly stating he wanted to sit in dayroom and watch tv. In no distress. Encouragement and support provided. Safety checks maintained. Meds given as prescribed. Pt receptive and remains safe on unit with q 15 min checks.

## 2023-02-28 NOTE — Group Note (Signed)
Date:  02/28/2023 Time:  9:37 PM  Group Topic/Focus:  Wrap-Up Group:   The focus of this group is to help patients review their daily goal of treatment and discuss progress on daily workbooks.    Participation Level:  Active  Participation Quality:  Appropriate, Attentive, Sharing, and Supportive  Affect:  Appropriate  Cognitive:  Appropriate  Insight: Appropriate and Good  Engagement in Group:  Supportive  Modes of Intervention:  Discussion and Support  Additional Comments:     Belva Crome 02/28/2023, 9:37 PM

## 2023-02-28 NOTE — Plan of Care (Signed)
  Problem: Education: Goal: Knowledge of [facility] Education information/materials will improve Outcome: Adequate for Discharge Goal: Emotional status will improve Outcome: Adequate for Discharge Goal: Mental status will improve Outcome: Adequate for Discharge Goal: Verbalization of understanding the information provided will improve Outcome: Adequate for Discharge   Problem: Activity: Goal: Interest or engagement in activities will improve Outcome: Adequate for Discharge Goal: Sleeping patterns will improve Outcome: Adequate for Discharge   Problem: Coping: Goal: Ability to verbalize frustrations and anger appropriately will improve Outcome: Adequate for Discharge Goal: Ability to demonstrate self-control will improve Outcome: Adequate for Discharge   Problem: Coping: Goal: Ability to verbalize frustrations and anger appropriately will improve Outcome: Adequate for Discharge Goal: Ability to demonstrate self-control will improve Outcome: Adequate for Discharge   Problem: Health Behavior/Discharge Planning: Goal: Identification of resources available to assist in meeting health care needs will improve Outcome: Adequate for Discharge Goal: Compliance with treatment plan for underlying cause of condition will improve Outcome: Adequate for Discharge   Problem: Health Behavior/Discharge Planning: Goal: Identification of resources available to assist in meeting health care needs will improve Outcome: Adequate for Discharge Goal: Compliance with treatment plan for underlying cause of condition will improve Outcome: Adequate for Discharge   Problem: Physical Regulation: Goal: Ability to maintain clinical measurements within normal limits will improve Outcome: Adequate for Discharge   Problem: Safety: Goal: Periods of time without injury will increase Outcome: Adequate for Discharge

## 2023-02-28 NOTE — Progress Notes (Signed)
[facility] MD Progress Note  02/28/2023 7:16 AM Anna [name]  MRN:  [id number]   Subjective:   Notes, vital signs, and labs reviewed.  [name] was participating in group prior to assessment. "I am doing better, my ADHD is driving me insane. My ADHD medications make my anxiety worse but if I don't take them my anxiety is bad because my ADHD is worse. I take 30mg  XR Adderall in the morning and then I take 20mg  in the afternoon, my provider outpatient prescribes all of my medication." Client reports anxiety 7/10 and denies depression. Reports sleep and appetite are good. No suicidal/ homicidal ideations, hallucinations and paranoia Denies medication side effects. Reminded of discharge plan being tomorrow, client is agreeable.   Principal Problem:  Major depressive disorder, recurrent, severe without psychosis Diagnosis:  Major depressive disorder, recurrent, severe without psychosis  Total Time spent with patient: 30 minutes  Past Psychiatric History: BPD, anxiety, depression, PTSD, ADHD  Past Medical History:  Past Medical History:  Diagnosis Date   Anxiety    Borderline personality disorder (HCC)    Depression    Depression    Phreesia 02/24/2020   Enlarged heart    Pneumonia    PTSD (post-traumatic stress disorder)     Past Surgical History:  Procedure Laterality Date   WISDOM TOOTH EXTRACTION     Family History:  Family History  Problem Relation Age of Onset   Clotting disorder Mother    Cancer Father    COPD Father        skin   Depression Father    Healthy Sister    Healthy Brother    Cancer Maternal Grandmother        breast   Colon cancer Paternal Grandmother    Family Psychiatric  History: see above Social History:  Social History   Substance and Sexual Activity  Alcohol Use Not Currently     Social History   Substance and Sexual Activity  Drug Use Not Currently   Comment: 2017 Marijuana    Social History   Socioeconomic History   Marital status: Single     Spouse name: Not on file   Number of children: 0   Years of education: Not on file   Highest education level: Not on file  Occupational History   Not on file  Tobacco Use   Smoking status: Former    Current packs/day: 0.00    Average packs/day: 0.3 packs/day for 3.0 years (0.8 ttl pk-yrs)    Types: Cigarettes    Start date: 10/22/2014    Quit date: 10/21/2017    Years since quitting: 5.3   Smokeless tobacco: Never  Vaping Use   Vaping status: Every Day  Substance and Sexual Activity   Alcohol use: Not Currently   Drug use: Not Currently    Comment: 2017 Marijuana   Sexual activity: Not Currently    Partners: Male    Birth control/protection: Implant  Other Topics Concern   Not on file  Social History Narrative   Not on file   Social Determinants of Health   Financial Resource Strain: Not on file  Food Insecurity: Not on file  Transportation Needs: Patient Declined (02/23/2023)   PRAPARE - Administrator, Civil Service (Medical): Patient declined    Lack of Transportation (Non-Medical): Patient declined  Physical Activity: Not on file  Stress: Not on file  Social Connections: Not on file   Additional Social History: lives with parents     Sleep:  Fair  Appetite:  Fair  Current Medications: Current Facility-Administered Medications  Medication Dose Route Frequency Provider Last Rate Last Admin   acetaminophen (TYLENOL) tablet 650 mg  650 mg Oral Q6H PRN Jearld Lesch, NP   650 mg at 02/27/23 0626   albuterol (VENTOLIN HFA) 108 (90 Base) MCG/ACT inhaler 2 puff  2 puff Inhalation Q4H PRN Myriam Forehand, NP   2 puff at 02/27/23 1729   alum & mag hydroxide-simeth (MAALOX/MYLANTA) 200-200-20 MG/5ML suspension 30 mL  30 mL Oral Q4H PRN Jearld Lesch, NP       ARIPiprazole (ABILIFY) tablet 2 mg  2 mg Oral QHS Dixon, Rashaun M, NP   2 mg at 02/27/23 2118   diphenhydrAMINE (BENADRYL) capsule 50 mg  50 mg Oral TID PRN Jearld Lesch, NP       Or    diphenhydrAMINE (BENADRYL) injection 50 mg  50 mg Intramuscular TID PRN Jearld Lesch, NP       gabapentin (NEURONTIN) capsule 300 mg  300 mg Oral TID Charm Rings, NP   300 mg at 02/27/23 1728   haloperidol (HALDOL) tablet 5 mg  5 mg Oral TID PRN Jearld Lesch, NP       Or   haloperidol lactate (HALDOL) injection 5 mg  5 mg Intramuscular TID PRN Jearld Lesch, NP       magnesium hydroxide (MILK OF MAGNESIA) suspension 30 mL  30 mL Oral Daily PRN Jearld Lesch, NP       traZODone (DESYREL) tablet 100 mg  100 mg Oral QHS Charm Rings, NP   100 mg at 02/27/23 2243   venlafaxine (EFFEXOR) tablet 50 mg  50 mg Oral BID WC Myriam Forehand, NP   50 mg at 02/27/23 1728    Lab Results:  Results for orders placed or performed during the hospital encounter of 02/23/23 (from the past 48 hour(s))  Lipid panel     Status: Abnormal   Collection Time: 02/27/23  6:10 AM  Result Value Ref Range   Cholesterol 165 0 - 200 mg/dL   Triglycerides 74 <161 mg/dL   HDL 44 >09 mg/dL   Total CHOL/HDL Ratio 3.8 RATIO   VLDL 15 0 - 40 mg/dL   LDL Cholesterol 604 (H) 0 - 99 mg/dL    Comment:        Total Cholesterol/HDL:CHD Risk Coronary Heart Disease Risk Table                     Men   Women  1/2 Average Risk   3.4   3.3  Average Risk       5.0   4.4  2 X Average Risk   9.6   7.1  3 X Average Risk  23.4   11.0        Use the calculated Patient Ratio above and the CHD Risk Table to determine the patient's CHD Risk.        ATP III CLASSIFICATION (LDL):  <100     mg/dL   Optimal  540-981  mg/dL   Near or Above                    Optimal  130-159  mg/dL   Borderline  191-478  mg/dL   High  >295     mg/dL   Very High Performed at [facility], [address] [place], [place] [address]   Hemoglobin  A1c     Status: None   Collection Time: 02/27/23  6:10 AM  Result Value Ref Range   Hgb A1c MFr Bld 4.9 4.8 - 5.6 %    Comment: (NOTE) Pre diabetes:          5.7%-6.4%  Diabetes:               >6.4%  Glycemic control for   <7.0% adults with diabetes    Mean Plasma Glucose 93.93 mg/dL    Comment: Performed at [facility], [address] [place], [place] [address]  Glucose, capillary     Status: None   Collection Time: 02/27/23  8:57 PM  Result Value Ref Range   Glucose-Capillary 88 70 - 99 mg/dL    Comment: Glucose reference range applies only to samples taken after fasting for at least 8 hours.    Blood Alcohol level:  Lab Results  Component Value Date   ETH <10 02/22/2023    Metabolic Disorder Labs: Lab Results  Component Value Date   HGBA1C 4.9 02/27/2023   MPG 93.93 02/27/2023   MPG 99.67 02/22/2023   No results found for: "PROLACTIN" Lab Results  Component Value Date   CHOL 165 02/27/2023   TRIG 74 02/27/2023   HDL 44 02/27/2023   CHOLHDL 3.8 02/27/2023   VLDL 15 02/27/2023   LDLCALC 106 (H) 02/27/2023   LDLCALC 130 (H) 02/22/2023    Physical Findings: AIMS:  , ,  ,  ,    CIWA:    COWS:     Musculoskeletal: Strength & Muscle Tone: within normal limits Gait & Station: normal Patient leans: N/A  Psychiatric Specialty Exam: Physical Exam Vitals and nursing note reviewed.  Constitutional:      Appearance: Normal appearance.  HENT:     Head: Normocephalic.     Nose: Nose normal.  Pulmonary:     Effort: Pulmonary effort is normal.  Musculoskeletal:        General: Normal range of motion.     Cervical back: Normal range of motion.  Neurological:     General: No focal deficit present.     Mental Status: He is alert and oriented to person, place, and time.     Review of Systems  Psychiatric/Behavioral:  The patient is nervous/anxious.   All other systems reviewed and are negative.   Blood pressure (!) 108/53, pulse 81, temperature 97.9 F (36.6 C), temperature source Oral, resp. rate 17, height 5\' 4"  (1.626 m), weight 127.9 kg, SpO2 99%.Body mass index is 48.41 kg/m.  General Appearance: Casual  Eye Contact:  Good   Speech:  Normal Rate  Volume:  Normal  Mood:  Anxious  Affect:  Congruent  Thought Process:  Coherent  Orientation:  Full (Time, Place, and Person)  Thought Content:  WDL  Suicidal Thoughts:  No  Homicidal Thoughts:  No  Memory:  Immediate;   Good Recent;   Good Remote;   Good  Judgement:  Fair  Insight:  Fair  Psychomotor Activity:  Normal  Concentration:  Concentration: Good and Attention Span: Good  Recall:  Good  Fund of Knowledge:  Good  Language:  Good  Akathisia:  No  Handed:  Right  AIMS (if indicated):     Assets:  Housing Leisure Time Physical Health Resilience Social Support  ADL's:  Intact  Cognition:  WNL  Sleep:         Physical Exam: Physical Exam Vitals and nursing note reviewed.  Constitutional:  Appearance: Normal appearance.  HENT:     Head: Normocephalic.     Nose: Nose normal.  Pulmonary:     Effort: Pulmonary effort is normal.  Musculoskeletal:        General: Normal range of motion.     Cervical back: Normal range of motion.  Neurological:     General: No focal deficit present.     Mental Status: He is alert and oriented to person, place, and time.    Review of Systems  Psychiatric/Behavioral:  The patient is nervous/anxious.   All other systems reviewed and are negative.  Blood pressure (!) 108/53, pulse 81, temperature 97.9 F (36.6 C), temperature source Oral, resp. rate 17, height 5\' 4"  (1.626 m), weight 127.9 kg, SpO2 99%. Body mass index is 48.41 kg/m.   Treatment Plan Summary: Daily contact with patient to assess and evaluate symptoms and progress in treatment, Medication management, and Plan : Major depressive disorder, recurrent, severe without psychosis Effexor 50 mg BID Abilify 2 mg daily:  Lipid panel WDL except LDL 106H, A1C 4.9, EKG with no issues, and TSH 2.533   Anxiety Gabapentin 100 mg TID  Insomnia: Trazodone 100 mg at bedtime  Nanine Means, NP 02/28/2023, 7:16 AM

## 2023-02-28 NOTE — Plan of Care (Signed)
  Problem: Education: Goal: Knowledge of [facility] Education information/materials will improve Outcome: Progressing Goal: Emotional status will improve Outcome: Progressing Goal: Mental status will improve Outcome: Progressing   Problem: Coping: Goal: Ability to verbalize frustrations and anger appropriately will improve Outcome: Progressing

## 2023-02-28 NOTE — Progress Notes (Signed)
D- Patient alert and oriented x4, affect/mood appropriate to situation and she denies SI, HI, plan or intent. Also denied AVH, or pain. Pt focused on been discharge tomorrow, was compliant with her medication regimen and seek clarification for management after discharge.. Support and encouragement provided.  Routine safety checks conducted every 15 minutes.  Patient informed to notify staff with problems or concerns. R- No adverse drug reactions noted. Patient contracts for safety at this time. Patient compliant with medications and treatment plan. Patient receptive, calm, and cooperative. Patient interacts well with others on the unit.  Patient remains safe at this time.

## 2023-03-01 ENCOUNTER — Other Ambulatory Visit: Payer: Self-pay

## 2023-03-01 ENCOUNTER — Other Ambulatory Visit (HOSPITAL_COMMUNITY): Payer: Self-pay | Admitting: Psychiatry

## 2023-03-01 DIAGNOSIS — F322 Major depressive disorder, single episode, severe without psychotic features: Secondary | ICD-10-CM | POA: Diagnosis not present

## 2023-03-01 MED ORDER — VENLAFAXINE HCL 50 MG PO TABS: 50.0000 mg | ORAL_TABLET | Freq: Two times a day (BID) | ORAL | 0 refills | Status: AC

## 2023-03-01 MED ORDER — TRAZODONE HCL 100 MG PO TABS: 100.0000 mg | ORAL_TABLET | Freq: Every day | ORAL | 0 refills | Status: DC

## 2023-03-01 MED ORDER — GABAPENTIN 300 MG PO CAPS: 300.0000 mg | ORAL_CAPSULE | Freq: Three times a day (TID) | ORAL | 0 refills | Status: AC

## 2023-03-01 MED ORDER — GABAPENTIN 300 MG PO CAPS: 300.0000 mg | ORAL_CAPSULE | Freq: Three times a day (TID) | ORAL | 0 refills | Status: DC

## 2023-03-01 MED ORDER — TRAZODONE HCL 100 MG PO TABS: 100.0000 mg | ORAL_TABLET | Freq: Every day | ORAL | 0 refills | Status: AC

## 2023-03-01 MED ORDER — VENLAFAXINE HCL 50 MG PO TABS: 50.0000 mg | ORAL_TABLET | Freq: Two times a day (BID) | ORAL | 0 refills | Status: DC

## 2023-03-01 NOTE — Discharge Summary (Signed)
Physician Discharge Summary Note  Patient:  Anna [name] is an 27 y.o., adult MRN:  [id number] DOB:  23-Feb-1996 Patient phone:  [phone] (home)  Patient address:   [address] [place] [address],  Total Time spent with patient: 45 minutes  Date of Admission:  02/23/2023 Date of Discharge: 03/01/2023  Reason for Admission:  depression with suicidal ideations  Principal Problem: Major depressive disorder, single episode, severe without psychotic features (HCC) Discharge Diagnoses: Principal Problem:   Major depressive disorder, single episode, severe without psychotic features (HCC) Active Problems:   Borderline personality disorder (HCC)   PTSD (post-traumatic stress disorder)   Past Psychiatric History: depression, PTSD, BPD  Past Medical History:  Past Medical History:  Diagnosis Date   Anxiety    Borderline personality disorder (HCC)    Depression    Depression    Phreesia 02/24/2020   Enlarged heart    Pneumonia    PTSD (post-traumatic stress disorder)     Past Surgical History:  Procedure Laterality Date   WISDOM TOOTH EXTRACTION     Family History:  Family History  Problem Relation Age of Onset   Clotting disorder Mother    Cancer Father    COPD Father        skin   Depression Father    Healthy Sister    Healthy Brother    Cancer Maternal Grandmother        breast   Colon cancer Paternal Grandmother    Family Psychiatric  History: see above Social History:  Social History   Substance and Sexual Activity  Alcohol Use Not Currently     Social History   Substance and Sexual Activity  Drug Use Not Currently   Comment: 2017 Marijuana    Social History   Socioeconomic History   Marital status: Single    Spouse name: Not on file   Number of children: 0   Years of education: Not on file   Highest education level: Not on file  Occupational History   Not on file  Tobacco Use   Smoking status: Former    Current packs/day:  0.00    Average packs/day: 0.3 packs/day for 3.0 years (0.8 ttl pk-yrs)    Types: Cigarettes    Start date: 10/22/2014    Quit date: 10/21/2017    Years since quitting: 5.3   Smokeless tobacco: Never  Vaping Use   Vaping status: Every Day  Substance and Sexual Activity   Alcohol use: Not Currently   Drug use: Not Currently    Comment: 2017 Marijuana   Sexual activity: Not Currently    Partners: Male    Birth control/protection: Implant  Other Topics Concern   Not on file  Social History Narrative   Not on file   Social Determinants of Health   Financial Resource Strain: Not on file  Food Insecurity: Not on file  Transportation Needs: Patient Declined (02/23/2023)   PRAPARE - Administrator, Civil Service (Medical): Patient declined    Lack of Transportation (Non-Medical): Patient declined  Physical Activity: Not on file  Stress: Not on file  Social Connections: Not on file    Hospital Course:   27 yo female to female transgender client presented with suicidal ideations.  Medications were started and adjusted along with therapy initiation.  She responded well with no depression the past couple of days, no suicidal ideations.  Mild to moderate anxiety depending on her stress level, no panic attacks.  Denies hallucinations,  paranoia, homicidal ideations, and substance abuse.  [name] has met maximum benefit of hospitalization.  Discharge instructions provided with explanations along with crisis numbers, Rx, and follow up appointment information.  Musculoskeletal: Strength & Muscle Tone: within normal limits Gait & Station: normal Patient leans: N/A   Psychiatric Specialty Exam: Physical Exam Vitals and nursing note reviewed.  Constitutional:      Appearance: Normal appearance.  HENT:     Head: Normocephalic.     Nose: Nose normal.  Pulmonary:     Effort: Pulmonary effort is normal.  Musculoskeletal:        General: Normal range of motion.     Cervical back:  Normal range of motion.  Neurological:     General: No focal deficit present.     Mental Status: He is alert and oriented to person, place, and time.       Review of Systems  Psychiatric/Behavioral:  The patient is nervous/anxious.   All other systems reviewed and are negative.    Blood pressure 106/63, pulse 82, temperature 97.9 F (36.6 C), temperature source Oral, resp. rate 17, height 5\' 4"  (1.626 m), weight 127.9 kg, SpO2 99%.Body mass index is 48.41 kg/m.  General Appearance: Casual  Eye Contact:  Good  Speech:  Normal Rate  Volume:  Normal  Mood:  Anxious  Affect:  Congruent  Thought Process:  Coherent  Orientation:  Full (Time, Place, and Person)  Thought Content:  WDL and Logical  Suicidal Thoughts:  No  Homicidal Thoughts:  No  Memory:  Immediate;   Good Recent;   Good Remote;   Good  Judgement:  Fair  Insight:  Fair  Psychomotor Activity:  Normal  Concentration:  Concentration: Good and Attention Span: Good  Recall:  Good  Fund of Knowledge:  Good  Language:  Good  Akathisia:  No  Handed:  Right  AIMS (if indicated):     Assets:  Housing Leisure Time Physical Health Resilience Social Support  ADL's:  Intact  Cognition:  WNL  Sleep:         Physical Exam: Physical Exam Vitals and nursing note reviewed.  Constitutional:      Appearance: Normal appearance.  HENT:     Head: Normocephalic.     Nose: Nose normal.  Pulmonary:     Effort: Pulmonary effort is normal.  Musculoskeletal:        General: Normal range of motion.     Cervical back: Normal range of motion.  Neurological:     General: No focal deficit present.     Mental Status: He is alert and oriented to person, place, and time.    Review of Systems  Psychiatric/Behavioral:  The patient is nervous/anxious.   All other systems reviewed and are negative.  Blood pressure 106/63, pulse 82, temperature 97.9 F (36.6 C), temperature source Oral, resp. rate 17, height 5\' 4"  (1.626 m),  weight 127.9 kg, SpO2 99%. Body mass index is 48.41 kg/m.   Social History   Tobacco Use  Smoking Status Former   Current packs/day: 0.00   Average packs/day: 0.3 packs/day for 3.0 years (0.8 ttl pk-yrs)   Types: Cigarettes   Start date: 10/22/2014   Quit date: 10/21/2017   Years since quitting: 5.3  Smokeless Tobacco Never   Tobacco Cessation:  A prescription for an FDA-approved tobacco cessation medication was offered at discharge and the patient refused   Blood Alcohol level:  Lab Results  Component Value Date   [facility] <10 02/22/2023  Metabolic Disorder Labs:  Lab Results  Component Value Date   HGBA1C 4.9 02/27/2023   MPG 93.93 02/27/2023   MPG 99.67 02/22/2023   No results found for: "PROLACTIN" Lab Results  Component Value Date   CHOL 165 02/27/2023   TRIG 74 02/27/2023   HDL 44 02/27/2023   CHOLHDL 3.8 02/27/2023   VLDL 15 02/27/2023   LDLCALC 106 (H) 02/27/2023   LDLCALC 130 (H) 02/22/2023    See Psychiatric Specialty Exam and Suicide Risk Assessment completed by Attending Physician prior to discharge.  Discharge destination:  Home  Is patient on multiple antipsychotic therapies at discharge:  No   Has Patient had three or more failed trials of antipsychotic monotherapy by history:  No  Recommended Plan for Multiple Antipsychotic Therapies: NA    Follow-up Information     Milagros Evener, MD Follow up.   Specialty: Psychiatry Why: Appointment is scheduled for 03/04/2023 at 2PM.  PLEASE CALL TO LET THEM KNOW IF YOU WANT THE APPOINTMENT VIRTUAL OR IN PERSON Contact information: [address] [place] [address] [phone]         [facility] Follow up.   Contact information: [address] [place], [place] [address] [phone]                Follow-up recommendations:  Activity:  as tolerated Diet:  heart healthy diet Major depressive disorder, recurrent, severe without  psychosis Effexor 50 mg BID Abilify 2 mg daily:  Lipid panel WDL except LDL 106H, A1C 4.9, EKG with no issues, and TSH 2.533    Anxiety Gabapentin 100 mg TID   Insomnia: Trazodone 100 mg at bedtime  Comments:  follow up with appointments above  Signed: Nanine Means, NP 03/01/2023, 7:23 AM

## 2023-03-01 NOTE — Progress Notes (Addendum)
Patient just called back to the unit asking about her medications, stating that she was supposed to get a seven-day supply. This Clinical research associate explained to patient that her medications were sent to [facility] and being that she has insurance, she can't get a medication supply. Patient was frustrated, stating that she's going home in nonsense and hung up on this Clinical research associate. This is why this writer had to come back into patient chart after discharge.

## 2023-03-01 NOTE — Group Note (Signed)
Recreation Therapy Group Note   Group Topic:Leisure Education  Group Date: 03/01/2023 Start Time: 1000 End Time: 1100 Facilitators: Rosina Lowenstein, LRT, CTRS Location:  Craft Room  Group Description: Leisure. Patients were given the option to choose from singing karaoke, coloring mandalas, using oil pastels, journaling, or playing with play-doh. LRT and pts discussed the meaning of leisure, the importance of participating in leisure during their free time/when they're outside of the hospital, as well as how our leisure interests can also serve as coping skills.   Goal Area(s) Addressed:  Patient will identify a current leisure interest.  Patient will learn the definition of "leisure". Patient will practice making a positive decision. Patient will have the opportunity to try a new leisure activity. Patient will communicate with peers and LRT.    Affect/Mood: Appropriate   Participation Level: Active and Engaged   Participation Quality: Independent   Behavior: Calm and Cooperative   Speech/Thought Process: Coherent   Insight: Good   Judgement: Good   Modes of Intervention: Activity   Patient Response to Interventions:  Attentive, Engaged, Interested , and Receptive   Education Outcome:  Acknowledges education   Clinical Observations/Individualized Feedback: Monico Blitz was active in their participation of session activities and group discussion. Pt identified "draw and crochet" as things he does in his free time. Pt chose to draw with oil pastels while in group. Pt interacted well with LRT and peers duration of session.    Plan: Continue to engage patient in RT group sessions 2-3x/week.   Rosina Lowenstein, LRT, CTRS 03/01/2023 12:11 PM

## 2023-03-01 NOTE — Group Note (Signed)
Date:  03/01/2023 Time:  12:32 PM  Group Topic/Focus:  Developing a Wellness Toolbox:   The focus of this group is to help patients develop a "wellness toolbox" with skills and strategies to promote recovery upon discharge.    Participation Level:  Active  Participation Quality:  Appropriate  Affect:  Appropriate  Cognitive:  Appropriate  Insight: Appropriate  Engagement in Group:  Engaged  Modes of Intervention:  Activity  Additional Comments:    Ophelia Shoulder 03/01/2023, 12:32 PM

## 2023-03-01 NOTE — Group Note (Signed)
Date:  03/01/2023 Time:  12:57 PM  Group Topic/Focus:  Coping With Mental Health Crisis:   The purpose of this group is to help patients identify strategies for coping with mental health crisis.  Group discusses possible causes of crisis and ways to manage them effectively. Goals Group:   The focus of this group is to help patients establish daily goals to achieve during treatment and discuss how the patient can incorporate goal setting into their daily lives to aide in recovery.    Participation Level:  Active  Participation Quality:  Appropriate  Affect:  Appropriate  Cognitive:  Appropriate  Insight: Appropriate  Engagement in Group:  Engaged  Modes of Intervention:  Discussion  Additional Comments:    Anna [name] 03/01/2023, 12:57 PM

## 2023-03-01 NOTE — Progress Notes (Signed)
Patient pleasant and cooperative on approach. Denies SI,HI and AVH. Verbalized understanding discharge instructions,prescriptions and follow up care.  All belongings returned from BMU locker. Suicide safety plan filled by patient and placed in chart. Copy given to patient.Patient escorted out by staff and transported by family. 

## 2023-03-01 NOTE — Progress Notes (Signed)
  [facility] Adult Case Management Discharge Plan :  Will you be returning to the same living situation after discharge:  Yes,  Patient returning home.  At discharge, do you have transportation home?: Yes,  Patient's father will be picking up.  Do you have the ability to pay for your medications: [facility] / [facility]   Release of information consent forms completed and in the chart;  Patient's signature needed at discharge.  Patient to Follow up at:  Follow-up Information     Milagros Evener, MD Follow up.   Specialty: Psychiatry Why: Appointment is scheduled for 03/04/2023 at 2PM.  PLEASE CALL TO LET THEM KNOW IF YOU WANT THE APPOINTMENT VIRTUAL OR IN PERSON Contact information: [address] [place] [address] [phone]                 Next level of care provider has access to [facility] Link:no  Safety Planning and Suicide Prevention discussed: Yes,  SPE completed with patient and patient's mother.      Has patient been referred to the Quitline?: Patient does not use tobacco/nicotine products  Patient has been referred for addiction treatment: No known substance use disorder.  Lowry Ram, LCSW 03/01/2023, 11:24 AM

## 2023-03-01 NOTE — Plan of Care (Signed)

## 2023-03-01 NOTE — [facility] Suicide Risk Assessment (Signed)
[facility] Discharge Suicide Risk Assessment   Principal Problem: Major depressive disorder, single episode, severe without psychotic features [facility]) Discharge Diagnoses: Principal Problem:   Major depressive disorder, single episode, severe without psychotic features (HCC) Active Problems:   Borderline personality disorder (HCC)   PTSD (post-traumatic stress disorder)   Total Time spent with patient: 45 minutes  Musculoskeletal: Strength & Muscle Tone: within normal limits Gait & Station: normal Patient leans: N/A  Psychiatric Specialty Exam: Physical Exam Vitals and nursing note reviewed.  Constitutional:      Appearance: Normal appearance.  HENT:     Head: Normocephalic.     Nose: Nose normal.  Pulmonary:     Effort: Pulmonary effort is normal.  Musculoskeletal:        General: Normal range of motion.     Cervical back: Normal range of motion.  Neurological:     General: No focal deficit present.     Mental Status: He is alert and oriented to person, place, and time.     Review of Systems  Psychiatric/Behavioral:  The patient is nervous/anxious.   All other systems reviewed and are negative.   Blood pressure 106/63, pulse 82, temperature 97.9 F (36.6 C), temperature source Oral, resp. rate 17, height 5\' 4"  (1.626 m), weight 127.9 kg, SpO2 99%.Body mass index is 48.41 kg/m.  General Appearance: Casual  Eye Contact:  Good  Speech:  Normal Rate  Volume:  Normal  Mood:  Anxious  Affect:  Congruent  Thought Process:  Coherent  Orientation:  Full (Time, Place, and Person)  Thought Content:  WDL and Logical  Suicidal Thoughts:  No  Homicidal Thoughts:  No  Memory:  Immediate;   Good Recent;   Good Remote;   Good  Judgement:  Fair  Insight:  Fair  Psychomotor Activity:  Normal  Concentration:  Concentration: Good and Attention Span: Good  Recall:  Good  Fund of Knowledge:  Good  Language:  Good  Akathisia:  No  Handed:  Right  AIMS (if indicated):     Assets:   Housing Leisure Time Physical Health Resilience Social Support  ADL's:  Intact  Cognition:  WNL  Sleep:        Physical Exam: Physical Exam Vitals and nursing note reviewed.  Constitutional:      Appearance: Normal appearance.  HENT:     Head: Normocephalic.     Nose: Nose normal.  Pulmonary:     Effort: Pulmonary effort is normal.  Musculoskeletal:        General: Normal range of motion.     Cervical back: Normal range of motion.  Neurological:     General: No focal deficit present.     Mental Status: He is alert and oriented to person, place, and time.    Review of Systems  Psychiatric/Behavioral:  The patient is nervous/anxious.   All other systems reviewed and are negative.  Blood pressure 106/63, pulse 82, temperature 97.9 F (36.6 C), temperature source Oral, resp. rate 17, height 5\' 4"  (1.626 m), weight 127.9 kg, SpO2 99%. Body mass index is 48.41 kg/m.  Mental Status Per Nursing Assessment::   On Admission:  Self-harm thoughts  Demographic Factors:  Caucasian, Gay, lesbian, or bisexual orientation, and Unemployed  Loss Factors: NA  Historical Factors: NA  Risk Reduction Factors:   Sense of responsibility to family, Living with another person, especially a relative, Positive social support, and Positive therapeutic relationship  Continued Clinical Symptoms:  Anxiety, mild  Cognitive Features That Contribute  To Risk:  None    Suicide Risk:  Minimal: No identifiable suicidal ideation.  Patients presenting with no risk factors but with morbid ruminations; may be classified as minimal risk based on the severity of the depressive symptoms   Follow-up Information     Milagros Evener, MD Follow up.   Specialty: Psychiatry Why: Appointment is scheduled for 03/04/2023 at 2PM.  PLEASE CALL TO LET THEM KNOW IF YOU WANT THE APPOINTMENT VIRTUAL OR IN PERSON Contact information: [address] [place] [address] [phone]          [facility] Follow up.   Contact information: [address] [place], [place] [address] [phone]                Plan Of Care/Follow-up recommendations:  Activity:  as tolerated Diet:  heart healthy diet Major depressive disorder, recurrent, severe without psychosis Effexor 50 mg BID Abilify 2 mg daily:  Lipid panel WDL except LDL 106H, A1C 4.9, EKG with no issues, and TSH 2.533    Anxiety Gabapentin 100 mg TID   Insomnia: Trazodone 100 mg at bedtime  Nanine Means, NP 03/01/2023, 7:18 AM

## 2023-05-29 ENCOUNTER — Ambulatory Visit: Payer: Self-pay | Admitting: Family Medicine
